# Patient Record
Sex: Male | Born: 1950 | Race: White | Hispanic: No | Marital: Married | Smoking: Never smoker
Health system: Southern US, Community
[De-identification: ages and names within clinical notes are randomized; demographics above are authoritative.]

## PROBLEM LIST (undated history)

## (undated) DIAGNOSIS — I10 Essential (primary) hypertension: Secondary | ICD-10-CM

## (undated) DIAGNOSIS — Z9889 Other specified postprocedural states: Secondary | ICD-10-CM

## (undated) DIAGNOSIS — E119 Type 2 diabetes mellitus without complications: Secondary | ICD-10-CM

## (undated) DIAGNOSIS — E039 Hypothyroidism, unspecified: Secondary | ICD-10-CM

## (undated) DIAGNOSIS — M1990 Unspecified osteoarthritis, unspecified site: Secondary | ICD-10-CM

## (undated) DIAGNOSIS — R112 Nausea with vomiting, unspecified: Secondary | ICD-10-CM

## (undated) DIAGNOSIS — C029 Malignant neoplasm of tongue, unspecified: Secondary | ICD-10-CM

## (undated) DIAGNOSIS — I639 Cerebral infarction, unspecified: Secondary | ICD-10-CM

## (undated) DIAGNOSIS — M109 Gout, unspecified: Secondary | ICD-10-CM

## (undated) DIAGNOSIS — K219 Gastro-esophageal reflux disease without esophagitis: Secondary | ICD-10-CM

## (undated) HISTORY — PX: OTHER SURGICAL HISTORY: SHX169

## (undated) HISTORY — PX: CYST EXCISION: SHX5701

## (undated) HISTORY — PX: TONGUE SURGERY: SHX810

## (undated) HISTORY — PX: FOOT SURGERY: SHX648

## (undated) HISTORY — PX: SHOULDER ARTHROSCOPY: SHX128

---

## 1998-02-19 ENCOUNTER — Encounter: Admission: RE | Admit: 1998-02-19 | Discharge: 1998-05-20 | Payer: Self-pay | Admitting: Dentistry

## 1998-07-16 ENCOUNTER — Encounter: Admission: RE | Admit: 1998-07-16 | Discharge: 1998-10-14 | Payer: Self-pay | Admitting: Dentistry

## 1998-11-18 ENCOUNTER — Encounter (HOSPITAL_COMMUNITY): Admission: RE | Admit: 1998-11-18 | Discharge: 1999-02-16 | Payer: Self-pay | Admitting: Dentistry

## 1999-06-09 ENCOUNTER — Encounter (HOSPITAL_COMMUNITY): Admission: RE | Admit: 1999-06-09 | Discharge: 1999-09-07 | Payer: Self-pay | Admitting: Dentistry

## 1999-11-03 ENCOUNTER — Encounter: Payer: Self-pay | Admitting: Orthopedic Surgery

## 1999-11-10 ENCOUNTER — Encounter (INDEPENDENT_AMBULATORY_CARE_PROVIDER_SITE_OTHER): Payer: Self-pay | Admitting: Specialist

## 1999-11-10 ENCOUNTER — Ambulatory Visit (HOSPITAL_COMMUNITY): Admission: RE | Admit: 1999-11-10 | Discharge: 1999-11-10 | Payer: Self-pay | Admitting: Orthopedic Surgery

## 2000-06-14 ENCOUNTER — Encounter (HOSPITAL_COMMUNITY): Admission: RE | Admit: 2000-06-14 | Discharge: 2000-09-12 | Payer: Self-pay | Admitting: Dentistry

## 2000-11-01 ENCOUNTER — Encounter: Payer: Self-pay | Admitting: Orthopedic Surgery

## 2000-11-01 ENCOUNTER — Ambulatory Visit (HOSPITAL_COMMUNITY): Admission: RE | Admit: 2000-11-01 | Discharge: 2000-11-01 | Payer: Self-pay | Admitting: Orthopedic Surgery

## 2000-11-26 ENCOUNTER — Encounter (HOSPITAL_COMMUNITY): Admission: RE | Admit: 2000-11-26 | Discharge: 2001-02-24 | Payer: Self-pay | Admitting: Dentistry

## 2001-05-31 ENCOUNTER — Encounter (HOSPITAL_COMMUNITY): Admission: RE | Admit: 2001-05-31 | Discharge: 2001-08-29 | Payer: Self-pay | Admitting: Dentistry

## 2001-11-29 ENCOUNTER — Encounter (HOSPITAL_COMMUNITY): Admission: RE | Admit: 2001-11-29 | Discharge: 2002-02-27 | Payer: Self-pay | Admitting: Dentistry

## 2002-06-12 ENCOUNTER — Encounter: Payer: Self-pay | Admitting: *Deleted

## 2002-06-12 ENCOUNTER — Encounter: Admission: RE | Admit: 2002-06-12 | Discharge: 2002-06-12 | Payer: Self-pay | Admitting: *Deleted

## 2003-03-12 ENCOUNTER — Encounter: Admission: RE | Admit: 2003-03-12 | Discharge: 2003-03-12 | Payer: Self-pay | Admitting: Dentistry

## 2004-12-30 ENCOUNTER — Ambulatory Visit: Payer: Self-pay | Admitting: Dentistry

## 2005-02-22 ENCOUNTER — Ambulatory Visit: Payer: Self-pay | Admitting: Dentistry

## 2005-04-05 ENCOUNTER — Ambulatory Visit: Payer: Self-pay | Admitting: Dentistry

## 2005-09-08 ENCOUNTER — Ambulatory Visit: Payer: Self-pay | Admitting: Dentistry

## 2005-12-14 ENCOUNTER — Ambulatory Visit (HOSPITAL_BASED_OUTPATIENT_CLINIC_OR_DEPARTMENT_OTHER): Admission: RE | Admit: 2005-12-14 | Discharge: 2005-12-14 | Payer: Self-pay | Admitting: Urology

## 2006-03-30 ENCOUNTER — Ambulatory Visit: Payer: Self-pay | Admitting: Dentistry

## 2006-11-09 ENCOUNTER — Ambulatory Visit: Payer: Self-pay | Admitting: Dentistry

## 2007-05-23 ENCOUNTER — Ambulatory Visit: Payer: Self-pay | Admitting: Dentistry

## 2008-10-07 ENCOUNTER — Ambulatory Visit (HOSPITAL_COMMUNITY): Admission: RE | Admit: 2008-10-07 | Discharge: 2008-10-08 | Payer: Self-pay | Admitting: Orthopedic Surgery

## 2008-10-07 ENCOUNTER — Ambulatory Visit: Admission: RE | Admit: 2008-10-07 | Discharge: 2008-12-16 | Payer: Self-pay | Admitting: Radiation Oncology

## 2010-02-08 ENCOUNTER — Emergency Department (HOSPITAL_BASED_OUTPATIENT_CLINIC_OR_DEPARTMENT_OTHER): Admission: EM | Admit: 2010-02-08 | Discharge: 2010-02-08 | Payer: Self-pay | Admitting: Emergency Medicine

## 2010-02-08 ENCOUNTER — Ambulatory Visit: Payer: Self-pay | Admitting: Diagnostic Radiology

## 2010-05-25 ENCOUNTER — Ambulatory Visit (HOSPITAL_COMMUNITY): Admission: RE | Admit: 2010-05-25 | Discharge: 2010-05-25 | Payer: Self-pay | Admitting: Orthopedic Surgery

## 2011-01-06 LAB — CBC
MCH: 30.5 pg (ref 26.0–34.0)
Platelets: 181 10*3/uL (ref 150–400)
RBC: 4.92 MIL/uL (ref 4.22–5.81)
RDW: 13.8 % (ref 11.5–15.5)
WBC: 4 10*3/uL (ref 4.0–10.5)

## 2011-01-06 LAB — SURGICAL PCR SCREEN: MRSA, PCR: NEGATIVE

## 2011-01-06 LAB — COMPREHENSIVE METABOLIC PANEL
AST: 44 U/L — ABNORMAL HIGH (ref 0–37)
Albumin: 3.9 g/dL (ref 3.5–5.2)
BUN: 13 mg/dL (ref 6–23)
Chloride: 108 mEq/L (ref 96–112)
Creatinine, Ser: 1.26 mg/dL (ref 0.4–1.5)
GFR calc Af Amer: >60 mL/min (ref >60)
Potassium: 4.3 mEq/L (ref 3.5–5.1)
Total Bilirubin: 0.8 mg/dL (ref 0.3–1.2)
Total Protein: 7.1 g/dL (ref 6.0–8.3)

## 2011-01-10 LAB — CBC
HCT: 44.2 % (ref 39.0–52.0)
Hemoglobin: 14.9 g/dL (ref 13.0–17.0)
MCHC: 33.8 g/dL (ref 30.0–36.0)
MCV: 89.1 fL (ref 78.0–100.0)
RBC: 4.96 MIL/uL (ref 4.22–5.81)
WBC: 5.7 10*3/uL (ref 4.0–10.5)

## 2011-01-10 LAB — DIFFERENTIAL
Basophils Relative: 3 % — ABNORMAL HIGH (ref 0–1)
Eosinophils Absolute: 0.2 10*3/uL (ref 0.0–0.7)
Eosinophils Relative: 3 % (ref 0–5)
Lymphs Abs: 1.4 10*3/uL (ref 0.7–4.0)
Monocytes Absolute: 0.5 10*3/uL (ref 0.1–1.0)
Monocytes Relative: 10 % (ref 3–12)

## 2011-01-10 LAB — BASIC METABOLIC PANEL
CO2: 27 mEq/L (ref 19–32)
Chloride: 107 mEq/L (ref 96–112)
Creatinine, Ser: 1.3 mg/dL (ref 0.4–1.5)
GFR calc Af Amer: >60 mL/min (ref >60)
Potassium: 4.5 mEq/L (ref 3.5–5.1)

## 2011-03-07 NOTE — Op Note (Signed)
NAME:  Henry Bridges, Henry Bridges              ACCOUNT NO.:  [id number]   MEDICAL RECORD NO.:  [id number]          PATIENT TYPE:  AMB   LOCATION:  DAY                          FACILITY:  [facility]   PHYSICIAN:  Marlowe Kays, M.D.  DATE OF BIRTH:  1950-12-24   DATE OF PROCEDURE:  10/07/2008  DATE OF DISCHARGE:                               OPERATIVE REPORT   PREOPERATIVE DIAGNOSIS:  Recurrent bone and calcium, right posterior  heel and Achilles tendon.   POSTOPERATIVE DIAGNOSIS:  Recurrent bone and calcium, right posterior  heel and Achilles tendon.   OPERATION:  Excision of bone and calcium, right posterior heel and  Achilles tendon, utilizing C-arm to be followed by 1 dose of radiation  therapy.   SURGEON:  Marlowe Kays, M.D.   ASSISTANT:  Nurse.   ANESTHESIA:  General.   PATHOLOGY AND JUSTIFICATION FOR PROCEDURE:  I excised some painful  calcium from his posterior heel back in 2001.  He is very active in  athletics, particularly softball, and has noted recurrent pain in this  area, with x-rays demonstrating extensive bone/calcium formation in the  Achilles tendon primarily.  Consequently he is here today for the above-  mentioned surgery.   DESCRIPTION OF PROCEDURE:  Under satisfactory general anesthesia,  pneumatic tourniquet, the right leg was esmarched out non sterilely and  the tourniquet inflated to 325 mmHg.  The right leg was prepped with  DuraPrep from the mid calf to the toes and draped in a sterile field.  I  made a curved hockey-stick incision medial to the Achilles tendon and  went down to the tendon, splitting it somewhat so that there was a  little remaining residual portion for suturing later.  Then using the C-  arm and a curette knife and small rongeur, I progressively removed all  the bone and calcium that I could locate without entirely destroying the  heel cord.  On the lateral projection he appeared to have complete  clearance of this on the oblique.  There may  have been a slight remnant  in the Achilles tendon but, again, I tried to preserve as much of the  Achilles tendon as possible.  At the conclusion of the removal, I  irrigated the wound well and was able to reapproximate the Achilles  tendon remaining posteriorly to what I had left on the os calcis with  multiple interrupted 0 Vicryl.  The soft tissues were infiltrated with  0.5% plain Marcaine and closure continued with interrupted 3-0 Vicryl in  the subcutaneous tissue and a running 4-0 nylon tailor stitch in the  skin.  Betadine, Adaptic, a dry sterile dressing with a well-padded  short-leg splint cast were applied.  He tolerated the procedure well and  was taken to the recovery room in satisfactory condition with no known  complications.           ______________________________  Marlowe Kays, M.D.     JA/MEDQ  D:  10/07/2008  T:  10/08/2008  Job:  [id number]

## 2011-03-10 NOTE — Op Note (Signed)
NAME:  Henry Bridges, Henry Bridges              ACCOUNT NO.:  [id number]   MEDICAL RECORD NO.:  [id number]          PATIENT TYPE:  AMB   LOCATION:  [facility]                         FACILITY:  [facility]   PHYSICIAN:  Ronald L. Earlene Plater, M.D.  DATE OF BIRTH:  January 24, 1951   DATE OF PROCEDURE:  DATE OF DISCHARGE:                                 OPERATIVE REPORT   DICTATED FOR:  Dr. Earlene Plater   PREOPERATIVE DIAGNOSIS:  Left hydrocele.   POSTOPERATIVE DIAGNOSIS:  Left hydrocele.   PROCEDURE:  1.  Left hydrocelectomy.  2.  Ablation of appendix testis.   SURGEON:  Lucrezia Starch. Earlene Plater, M.D.   ASSISTANT:  Glade Nurse, MD   ANESTHESIA:  General endotracheal.   SPECIMENS:  None.   DRAINS:  A 1/4-inch Penrose drain to the dependent portion of the left  scrotum.   PROCEDURE:  The patient was identified by his wrist bracelet and brought to  room #4 where he received preprocedure antibiotics and was administered  general anesthesia. He was prepped and draped in the usual sterile fashion  taking care to minimize risks of peripheral neuropathy or compartment  syndrome.   Next we made a transverse 3.5-cm incision across his left hemiscrotum. We  carried down through the detrusor fibers with Bovie cautery and right angle  dissection. The hydrocele sac was delivered into the field and using the  combination of blunt-and-sharp dissection we freed it from the overlying  testis, as far proximal on the cord as possible.  Next, we visualized the  testicle and the dependent portion of the hydrocele.  On opening the  hydrocele sac, anteriorly, we removed approximately 300 mL of straw-colored  fluid.  Next, we opened the hydrocele sac along its length from the inferior  portion of the testis proximally to over the cord. We then trimmed the  excess hydrocele sac with Bovie cautery, addressing all bleeding points. We  then examined the testicle; it was of normal shape and contour. There were  no palpable abnormalities. We  identified an appendix testis which was  ablated with Bovie cautery. We then bottle-necked the hydrocele sac over the  posterior aspect of the testicle and cord; and we ran it closed with a 3-0  Vicryl taking care to ensure that the bowel-necking was not too tight as to  prevent good perfusion of the testis and cord. When this was complete, we  visualized the testis. It was well-perfused.   Next we meticulously surveyed our operative field and addressed all bleeding  points with Bovie cautery. Next, we placed the testis back into the  hemiscrotum. Proper orientation is identified by the lateral sulcus. We then  placed a 1/4-inch Penrose drain in the dependent portion of left hemiscrotum  and fixed it to the skin with a 4-0 chromic.  Next, we closed the wound in 2 layers. We closed the dartos musculature with  a running 3-0 Vicryl. We then closed the skin with a running 4-0 chromic.  Dressings were applied. The patient was reversed from his anesthesia which  he tolerated without complication. Please note Dr. Darvin Neighbours was present and  participated in all aspects of this case.     ______________________________  Glade Nurse, MD      Lucrezia Starch. Earlene Plater, M.D.  Electronically Signed    MT/MEDQ  D:  12/14/2005  T:  12/15/2005  Job:  [id number]

## 2011-03-10 NOTE — Op Note (Signed)
[facility]  Patient:    Henry Bridges, Henry Bridges                     MRN: [id number] Proc. Date: 11/01/00 Adm. Date:  [id number] Attending:  Marlowe Kays Page                           Operative Report  PREOPERATIVE DIAGNOSIS:  Painful os calcis heel spur, left.  POSTOPERATIVE DIAGNOSIS:  Painful os calcis heel spur, left.  OPERATION:  Removal of painful posterior os calcis spur, left foot.  SURGEON:  Illene Labrador. [place], M.D.  ASSISTANT:  Nurse.  ANESTHESIA:  General.  PATHOLOGY AND JUSTIFICATION FOR PROCEDURE:  I previously removed one from his right heel roughly a year ago with excellent result.  He has a large spur digging up in his Achilles tendon which has failed to respond to nonsurgical treatment.  DESCRIPTION OF PROCEDURE:  Satisfactory general anesthesia, pneumatic tourniquet applied.  He was then placed in the prone position on rolls, and his left lower extremity from mid calf to toes was prepped with Duraprep and draped in a sterile field.  I made a curved incision started proximally and laterally and curving about the level of the heel spur transversely and then medially over the inner heel.  Skin incision was carried down to the Achilles tendon.  There was a little fibrovascular tissue which I excised.  The heel cord was then split vertically and the spur identified and resected with small rongeur.  There appeared to be some degeneration of the Achilles tendon fibers.  I then brought in the C-arm and with lateral x-ray noted that there was still a small residual portion of bone remaining which I removed.  On follow-up lateral C-arm x-ray, the heel spur had been completely removed.  I saved this picture for permanency.  The wound was irrigated with sterile saline, and soft tissue was infiltrated with 0.5% plain Marcaine.  The Achilles tendon was reapproximated with interrupted 0 Vicryl and skin with 4-0 nylon mattress sutures.  Betadine  and Adaptic dry sterile dressing and short leg splint cast was applied.  He tolerated the procedure well and was taken to the recovery room in satisfactory condition. DD:  11/01/00 TD:  11/02/00 Job: [id number]

## 2011-04-23 DEATH — deceased

## 2011-07-28 LAB — BASIC METABOLIC PANEL
BUN: 15 mg/dL (ref 6–23)
CO2: 27 mEq/L (ref 19–32)
Calcium: 9.5 mg/dL (ref 8.4–10.5)
GFR calc non Af Amer: 59 mL/min — ABNORMAL LOW (ref >60)
Glucose, Bld: 111 mg/dL — ABNORMAL HIGH (ref 70–99)
Sodium: 138 mEq/L (ref 135–145)

## 2013-06-16 ENCOUNTER — Other Ambulatory Visit: Payer: Self-pay | Admitting: Family Medicine

## 2013-06-16 DIAGNOSIS — R748 Abnormal levels of other serum enzymes: Secondary | ICD-10-CM

## 2013-06-26 ENCOUNTER — Ambulatory Visit
Admission: RE | Admit: 2013-06-26 | Discharge: 2013-06-26 | Disposition: A | Discharge status: Home / Self Care | Payer: 59 | Source: Ambulatory Visit | Attending: Family Medicine | Admitting: Family Medicine

## 2013-06-26 DIAGNOSIS — R748 Abnormal levels of other serum enzymes: Secondary | ICD-10-CM

## 2017-07-11 DIAGNOSIS — M7501 Adhesive capsulitis of right shoulder: Secondary | ICD-10-CM | POA: Diagnosis not present

## 2017-07-16 DIAGNOSIS — Z13220 Encounter for screening for lipoid disorders: Secondary | ICD-10-CM | POA: Diagnosis not present

## 2017-07-16 DIAGNOSIS — Z0000 Encounter for general adult medical examination without abnormal findings: Secondary | ICD-10-CM | POA: Diagnosis not present

## 2017-07-16 DIAGNOSIS — E119 Type 2 diabetes mellitus without complications: Secondary | ICD-10-CM | POA: Diagnosis not present

## 2017-07-16 DIAGNOSIS — M109 Gout, unspecified: Secondary | ICD-10-CM | POA: Diagnosis not present

## 2017-07-18 DIAGNOSIS — Z0000 Encounter for general adult medical examination without abnormal findings: Secondary | ICD-10-CM | POA: Diagnosis not present

## 2017-07-18 DIAGNOSIS — Z23 Encounter for immunization: Secondary | ICD-10-CM | POA: Diagnosis not present

## 2017-07-18 DIAGNOSIS — E119 Type 2 diabetes mellitus without complications: Secondary | ICD-10-CM | POA: Diagnosis not present

## 2017-08-20 DIAGNOSIS — M25511 Pain in right shoulder: Secondary | ICD-10-CM | POA: Diagnosis not present

## 2017-08-20 DIAGNOSIS — G8929 Other chronic pain: Secondary | ICD-10-CM | POA: Diagnosis not present

## 2017-08-20 DIAGNOSIS — M7501 Adhesive capsulitis of right shoulder: Secondary | ICD-10-CM | POA: Diagnosis not present

## 2018-01-11 DIAGNOSIS — E119 Type 2 diabetes mellitus without complications: Secondary | ICD-10-CM | POA: Diagnosis not present

## 2018-08-06 DIAGNOSIS — E119 Type 2 diabetes mellitus without complications: Secondary | ICD-10-CM | POA: Diagnosis not present

## 2018-08-07 DIAGNOSIS — Z23 Encounter for immunization: Secondary | ICD-10-CM | POA: Diagnosis not present

## 2018-08-07 DIAGNOSIS — K760 Fatty (change of) liver, not elsewhere classified: Secondary | ICD-10-CM | POA: Diagnosis not present

## 2018-08-07 DIAGNOSIS — I10 Essential (primary) hypertension: Secondary | ICD-10-CM | POA: Diagnosis not present

## 2018-08-07 DIAGNOSIS — Z0000 Encounter for general adult medical examination without abnormal findings: Secondary | ICD-10-CM | POA: Diagnosis not present

## 2018-08-07 DIAGNOSIS — E039 Hypothyroidism, unspecified: Secondary | ICD-10-CM | POA: Diagnosis not present

## 2018-08-07 DIAGNOSIS — E1169 Type 2 diabetes mellitus with other specified complication: Secondary | ICD-10-CM | POA: Diagnosis not present

## 2018-08-07 DIAGNOSIS — Z8739 Personal history of other diseases of the musculoskeletal system and connective tissue: Secondary | ICD-10-CM | POA: Diagnosis not present

## 2019-05-07 ENCOUNTER — Observation Stay (HOSPITAL_BASED_OUTPATIENT_CLINIC_OR_DEPARTMENT_OTHER)
Admission: EM | Admit: 2019-05-07 | Discharge: 2019-05-09 | Disposition: A | Discharge status: Home / Self Care | Payer: BC Managed Care – PPO | Attending: Internal Medicine | Admitting: Internal Medicine

## 2019-05-07 ENCOUNTER — Other Ambulatory Visit: Payer: Self-pay

## 2019-05-07 ENCOUNTER — Observation Stay (HOSPITAL_COMMUNITY): Payer: BC Managed Care – PPO

## 2019-05-07 ENCOUNTER — Encounter (HOSPITAL_BASED_OUTPATIENT_CLINIC_OR_DEPARTMENT_OTHER): Payer: Self-pay

## 2019-05-07 ENCOUNTER — Emergency Department (HOSPITAL_BASED_OUTPATIENT_CLINIC_OR_DEPARTMENT_OTHER): Payer: BC Managed Care – PPO

## 2019-05-07 DIAGNOSIS — H538 Other visual disturbances: Secondary | ICD-10-CM | POA: Diagnosis not present

## 2019-05-07 DIAGNOSIS — Z85810 Personal history of malignant neoplasm of tongue: Secondary | ICD-10-CM | POA: Insufficient documentation

## 2019-05-07 DIAGNOSIS — Z7984 Long term (current) use of oral hypoglycemic drugs: Secondary | ICD-10-CM | POA: Diagnosis not present

## 2019-05-07 DIAGNOSIS — G459 Transient cerebral ischemic attack, unspecified: Principal | ICD-10-CM

## 2019-05-07 DIAGNOSIS — I10 Essential (primary) hypertension: Secondary | ICD-10-CM | POA: Diagnosis not present

## 2019-05-07 DIAGNOSIS — Z7982 Long term (current) use of aspirin: Secondary | ICD-10-CM | POA: Diagnosis not present

## 2019-05-07 DIAGNOSIS — R531 Weakness: Secondary | ICD-10-CM | POA: Diagnosis not present

## 2019-05-07 DIAGNOSIS — E119 Type 2 diabetes mellitus without complications: Secondary | ICD-10-CM

## 2019-05-07 DIAGNOSIS — M6281 Muscle weakness (generalized): Secondary | ICD-10-CM | POA: Diagnosis not present

## 2019-05-07 DIAGNOSIS — R51 Headache: Secondary | ICD-10-CM | POA: Insufficient documentation

## 2019-05-07 DIAGNOSIS — I771 Stricture of artery: Secondary | ICD-10-CM

## 2019-05-07 DIAGNOSIS — E039 Hypothyroidism, unspecified: Secondary | ICD-10-CM | POA: Insufficient documentation

## 2019-05-07 DIAGNOSIS — Z20828 Contact with and (suspected) exposure to other viral communicable diseases: Secondary | ICD-10-CM | POA: Insufficient documentation

## 2019-05-07 DIAGNOSIS — R29898 Other symptoms and signs involving the musculoskeletal system: Secondary | ICD-10-CM

## 2019-05-07 DIAGNOSIS — Z79899 Other long term (current) drug therapy: Secondary | ICD-10-CM | POA: Insufficient documentation

## 2019-05-07 HISTORY — DX: Gastro-esophageal reflux disease without esophagitis: K21.9

## 2019-05-07 HISTORY — DX: Type 2 diabetes mellitus without complications: E11.9

## 2019-05-07 HISTORY — DX: Essential (primary) hypertension: I10

## 2019-05-07 HISTORY — DX: Malignant neoplasm of tongue, unspecified: C02.9

## 2019-05-07 HISTORY — DX: Hypothyroidism, unspecified: E03.9

## 2019-05-07 LAB — COMPREHENSIVE METABOLIC PANEL
ALT: 24 U/L (ref 0–44)
AST: 23 U/L (ref 15–41)
Albumin: 4.3 g/dL (ref 3.5–5.0)
Alkaline Phosphatase: 73 U/L (ref 38–126)
Anion gap: 12 (ref 5–15)
BUN: 18 mg/dL (ref 8–23)
CO2: 23 mmol/L (ref 22–32)
Calcium: 9.4 mg/dL (ref 8.9–10.3)
Chloride: 103 mmol/L (ref 98–111)
Creatinine, Ser: 1.08 mg/dL (ref 0.61–1.24)
GFR calc Af Amer: >60 mL/min (ref >60)
GFR calc non Af Amer: >60 mL/min (ref >60)
Glucose, Bld: 171 mg/dL — ABNORMAL HIGH (ref 70–99)
Potassium: 3.7 mmol/L (ref 3.5–5.1)
Sodium: 138 mmol/L (ref 135–145)
Total Bilirubin: 0.9 mg/dL (ref 0.3–1.2)
Total Protein: 7.6 g/dL (ref 6.5–8.1)

## 2019-05-07 LAB — DIFFERENTIAL
Abs Immature Granulocytes: 0.01 10*3/uL (ref 0.00–0.07)
Basophils Absolute: 0 10*3/uL (ref 0.0–0.1)
Basophils Relative: 0 %
Eosinophils Absolute: 0.1 10*3/uL (ref 0.0–0.5)
Eosinophils Relative: 2 %
Immature Granulocytes: 0 %
Lymphocytes Relative: 13 %
Lymphs Abs: 0.8 10*3/uL (ref 0.7–4.0)
Monocytes Absolute: 0.4 10*3/uL (ref 0.1–1.0)
Monocytes Relative: 7 %
Neutro Abs: 4.8 10*3/uL (ref 1.7–7.7)
Neutrophils Relative %: 78 %

## 2019-05-07 LAB — APTT: aPTT: 25 seconds (ref 24–36)

## 2019-05-07 LAB — PROTIME-INR
INR: 0.9 (ref 0.8–1.2)
Prothrombin Time: 12.4 seconds (ref 11.4–15.2)

## 2019-05-07 LAB — CBC
HCT: 41.7 % (ref 39.0–52.0)
Hemoglobin: 13.8 g/dL (ref 13.0–17.0)
MCH: 29.4 pg (ref 26.0–34.0)
MCHC: 33.1 g/dL (ref 30.0–36.0)
MCV: 88.7 fL (ref 80.0–100.0)
Platelets: 193 10*3/uL (ref 150–400)
RBC: 4.7 MIL/uL (ref 4.22–5.81)
RDW: 12.5 % (ref 11.5–15.5)
WBC: 6.1 10*3/uL (ref 4.0–10.5)
nRBC: 0 % (ref 0.0–0.2)

## 2019-05-07 LAB — CBG MONITORING, ED: Glucose-Capillary: 161 mg/dL — ABNORMAL HIGH (ref 70–99)

## 2019-05-07 LAB — SARS CORONAVIRUS 2 BY RT PCR (HOSPITAL ORDER, PERFORMED IN ~~LOC~~ HOSPITAL LAB): SARS Coronavirus 2: NEGATIVE

## 2019-05-07 MED ORDER — ENOXAPARIN SODIUM 40 MG/0.4ML ~~LOC~~ SOLN
40.0000 mg | SUBCUTANEOUS | Status: DC
  Administered 2019-05-08 – 2019-05-09 (×2): 40 mg via SUBCUTANEOUS
  Filled 2019-05-07 (×2): qty 0.4

## 2019-05-07 MED ORDER — ASPIRIN 300 MG RE SUPP: 300.0000 mg | Freq: Every day | RECTAL | Status: DC

## 2019-05-07 MED ORDER — ASPIRIN 325 MG PO TABS
325.0000 mg | ORAL_TABLET | Freq: Every day | ORAL | Status: DC
  Administered 2019-05-08 (×2): 325 mg via ORAL
  Filled 2019-05-07 (×2): qty 1

## 2019-05-07 MED ORDER — PANTOPRAZOLE SODIUM 40 MG PO TBEC
40.0000 mg | DELAYED_RELEASE_TABLET | Freq: Every day | ORAL | Status: DC
  Administered 2019-05-08 – 2019-05-09 (×2): 40 mg via ORAL
  Filled 2019-05-07 (×2): qty 1

## 2019-05-07 MED ORDER — LEVOTHYROXINE SODIUM 25 MCG PO TABS
125.0000 ug | ORAL_TABLET | Freq: Every day | ORAL | Status: DC
  Administered 2019-05-08 – 2019-05-09 (×2): 125 ug via ORAL
  Filled 2019-05-07 (×2): qty 1

## 2019-05-07 MED ORDER — ACETAMINOPHEN 650 MG RE SUPP: 650.0000 mg | RECTAL | Status: DC | PRN

## 2019-05-07 MED ORDER — ACETAMINOPHEN 325 MG PO TABS
650.0000 mg | ORAL_TABLET | ORAL | Status: DC | PRN
  Administered 2019-05-09: 650 mg via ORAL
  Filled 2019-05-07: qty 2

## 2019-05-07 MED ORDER — INSULIN ASPART 100 UNIT/ML ~~LOC~~ SOLN
0.0000 [IU] | Freq: Three times a day (TID) | SUBCUTANEOUS | Status: DC
  Administered 2019-05-08: 1 [IU] via SUBCUTANEOUS

## 2019-05-07 MED ORDER — SODIUM CHLORIDE 0.9% FLUSH
3.0000 mL | Freq: Once | INTRAVENOUS | Status: DC
Start: 2019-05-07 — End: 2019-05-09
  Filled 2019-05-07: qty 3

## 2019-05-07 MED ORDER — ACETAMINOPHEN 160 MG/5ML PO SOLN: 650.0000 mg | ORAL | Status: DC | PRN

## 2019-05-07 MED ORDER — STROKE: EARLY STAGES OF RECOVERY BOOK
Freq: Once | Status: AC
  Administered 2019-05-08
  Filled 2019-05-07: qty 1

## 2019-05-07 MED ORDER — INSULIN ASPART 100 UNIT/ML ~~LOC~~ SOLN: 0.0000 [IU] | Freq: Every day | SUBCUTANEOUS | Status: DC

## 2019-05-07 MED ORDER — SENNOSIDES-DOCUSATE SODIUM 8.6-50 MG PO TABS: 1.0000 | ORAL_TABLET | Freq: Every evening | ORAL | Status: DC | PRN

## 2019-05-07 MED ORDER — SODIUM CHLORIDE 0.9 % IV SOLN
INTRAVENOUS | Status: AC
  Administered 2019-05-08: 01:00:00 via INTRAVENOUS

## 2019-05-07 NOTE — ED Notes (Signed)
ED Provider at bedside. 

## 2019-05-07 NOTE — ED Provider Notes (Signed)
[facility] EMERGENCY DEPARTMENT Provider Note   CSN: [id number] Arrival date & time: 05/07/19  1145     History   Chief Complaint Chief Complaint  Patient presents with  . Weakness    HPI Henry Bridges is a 68 y.o. male.  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  He said he was sitting at his computer sometime between 9 and 930 when he experienced a frontal headache associated with some blurry vision and some right hand clumsiness.  He said he could not talk with his right hand.  He said this lasted for about 30 minutes and resolved.  Currently no blurry vision and no difficulty with his hands.  Says he still has a little soreness behind his left eye.  He talked to his family members who made him come to the hospital.  He said he was a little nauseous in the car ride over.  No prior history of stroke.  No numbness no difficulty with gait.     The history is provided by the patient.  Cerebrovascular Accident This is a new problem. The current episode started 3 to 5 hours ago. The problem has been resolved. Associated symptoms include headaches. Pertinent negatives include no chest pain, no abdominal pain and no shortness of breath. Nothing aggravates the symptoms. Nothing relieves the symptoms. He has tried nothing for the symptoms. The treatment provided no relief.    Past Medical History:  Diagnosis Date  . Diabetes mellitus without complication ([place])   . GERD (gastroesophageal reflux disease)   . Hypertension   . Tongue cancer ([place])     There are no active problems to display for this patient.   Past Surgical History:  Procedure Laterality Date  . bone spur    . TONGUE SURGERY          Home Medications    Prior to Admission medications   Not on File    Family History No family history on file.  Social History Social History   Tobacco Use  . Smoking status: Never Smoker  . Smokeless tobacco: Never Used   Substance Use Topics  . Alcohol use: Not Currently  . Drug use: Not on file     Allergies   Aspirin   Review of Systems Review of Systems  Constitutional: Negative for fever.  HENT: Negative for sore throat.   Eyes: Positive for visual disturbance.  Respiratory: Negative for shortness of breath.   Cardiovascular: Negative for chest pain.  Gastrointestinal: Negative for abdominal pain.  Genitourinary: Negative for dysuria.  Musculoskeletal: Negative for neck pain.  Skin: Negative for rash.  Neurological: Positive for weakness and headaches. Negative for speech difficulty and numbness.     Physical Exam Updated Vital Signs BP 130/72 (BP Location: Right Arm)   Pulse (!) 50   Temp 98.2 F (36.8 C) (Oral)   Resp 16   SpO2 97%   Physical Exam Vitals signs and nursing note reviewed.  Constitutional:      Appearance: He is well-developed.  HENT:     Head: Normocephalic and atraumatic.  Eyes:     Conjunctiva/sclera: Conjunctivae normal.  Neck:     Musculoskeletal: Neck supple.  Cardiovascular:     Rate and Rhythm: Normal rate and regular rhythm.     Heart sounds: No murmur.  Pulmonary:     Effort: Pulmonary effort is normal. No respiratory distress.     Breath sounds: Normal breath sounds.  Abdominal:     Palpations: Abdomen is soft.     Tenderness: There is no abdominal tenderness.  Skin:    General: Skin is warm and dry.     Capillary Refill: Capillary refill takes less than 2 seconds.  Neurological:     General: No focal deficit present.     Mental Status: He is alert.     GCS: GCS eye subscore is 4. GCS verbal subscore is 5. GCS motor subscore is 6.     Cranial Nerves: No dysarthria.     Sensory: Sensation is intact.     Motor: Motor function is intact. No pronator drift.     Coordination: Coordination is intact. Coordination normal. Finger-Nose-Finger Test normal. Rapid alternating movements normal.     Comments: There is some slight deviation of his  tongue although he said he had prior surgery.      ED Treatments / Results  Labs (all labs ordered are listed, but only abnormal results are displayed) Labs Reviewed  COMPREHENSIVE METABOLIC PANEL - Abnormal; Notable for the following components:      Result Value   Glucose, Bld 171 (*)    All other components within normal limits  HEMOGLOBIN A1C - Abnormal; Notable for the following components:   Hgb A1c MFr Bld 6.8 (*)    All other components within normal limits  LIPID PANEL - Abnormal; Notable for the following components:   LDL Cholesterol 120 (*)    All other components within normal limits  GLUCOSE, CAPILLARY - Abnormal; Notable for the following components:   Glucose-Capillary 139 (*)    All other components within normal limits  GLUCOSE, CAPILLARY - Abnormal; Notable for the following components:   Glucose-Capillary 126 (*)    All other components within normal limits  CBG MONITORING, ED - Abnormal; Notable for the following components:   Glucose-Capillary 161 (*)    All other components within normal limits  SARS CORONAVIRUS 2 (HOSPITAL ORDER, [facility])  PROTIME-INR  APTT  CBC  DIFFERENTIAL  CBC  HIV ANTIBODY (ROUTINE TESTING W REFLEX)  RAPID URINE DRUG SCREEN, HOSP PERFORMED  CBG MONITORING, ED    EKG EKG Interpretation  Date/Time:  Wednesday May 07 2019 11:57:33 EDT Ventricular Rate:  52 PR Interval:  164 QRS Duration: 108 QT Interval:  444 QTC Calculation: 412 R Axis:   80 Text Interpretation:  Sinus bradycardia Otherwise normal ECG similar to prior 8/11 Confirmed by Aletta Edouard [phone]) on 05/07/2019 12:31:23 PM   Radiology [name] Contrast  Result Date: 05/08/2019 CLINICAL DATA:  68 y/o M; headache, blurred vision, right arm weakness. TIA, initial exam. History of tongue cancer post resection and radiation. EXAM: MRI HEAD WITHOUT CONTRAST TECHNIQUE: Multiplanar, multiecho pulse sequences of the brain and  surrounding structures were obtained without intravenous contrast. COMPARISON:  05/07/2019 CT head. FINDINGS: Brain: Mildly increased diffusion signal and mildly decreased ADC of the left caudate nucleus head and body on the axial diffusion sequence (series 8, image 30) and to lesser degree on the coronal diffusion sequence (series 1210, image 22). No associated hemorrhage or mass effect. On the axial DWI sequence there is also increased signal within the left anterior cingulate gyrus, right insula, and right hippocampus, however, this is not replicated on the coronal sequences and may be artifactual. No additional corresponding signal abnormality on other sequences. No acute hemorrhage, hydrocephalus, extra-axial collection, or mass effect. Scattered punctate nonspecific T2 FLAIR hyperintensities in subcortical and periventricular white matter  are compatible with mild chronic microvascular ischemic changes. Mild volume loss of the brain. Vascular: Normal flow voids. Skull and upper cervical spine: Normal marrow signal. Sinuses/Orbits: Negative. Other: None. IMPRESSION: Mildly decreased diffusion of the left caudate nucleus. Findings may represent acute/early subacute infarction, however, the distribution is somewhat atypical for lacunar infarction raising the question of seizure related activity or metabolic/toxic injury. Electronically Signed   By: Kristine Garbe M.D.   On: 05/08/2019 00:24   Ct Head Code Stroke Wo Contrast  Result Date: 05/07/2019 CLINICAL DATA:  Code stroke. Headache above the left eye, blurred vision, and right hand weakness lasting 15-30 minutes. EXAM: CT HEAD WITHOUT CONTRAST TECHNIQUE: Contiguous axial images were obtained from the base of the skull through the vertex without intravenous contrast. COMPARISON:  None. FINDINGS: Brain: There is no evidence of acute infarct, intracranial hemorrhage, mass, midline shift, or extra-axial fluid collection. The ventricles and sulci are  within normal limits for age. Patchy hypodensities in the cerebral white matter bilaterally are nonspecific but compatible with mild chronic small vessel ischemic disease. Vascular: No hyperdense vessel. Skull: No fracture or suspicious osseous lesion. Sinuses/Orbits: Visualized paranasal sinuses and mastoid air cells are clear. Visualized orbits are unremarkable. Other: None. ASPECTS [facility] Stroke Program Early CT Score) - Ganglionic level infarction (caudate, lentiform nuclei, internal capsule, insula, M1-M3 cortex): 7 - Supraganglionic infarction (M4-M6 cortex): 3 Total score (0-10 with 10 being normal): 10 IMPRESSION: 1. No evidence of acute intracranial abnormality. 2. ASPECTS is 10. 3. Mild chronic small vessel ischemic disease. These results were called by telephone at the time of interpretation on 05/07/2019 at 1:00 pm to Dr. Aletta Edouard , who verbally acknowledged these results. Electronically Signed   By: Logan Bores M.D.   On: 05/07/2019 13:01    Procedures Procedures (including critical care time)  Medications Ordered in ED Medications  sodium chloride flush (NS) 0.9 % injection 3 mL (has no administration in time range)     Initial Impression / Assessment and Plan / ED Course  I have reviewed the triage vital signs and the nursing notes.  Pertinent labs & imaging results that were available during my care of the patient were reviewed by me and considered in my medical decision making (see chart for details).  Clinical Course as of May 07 1040  Wed May 06, 6254  5143 68 year old male here with headache blurry vision and right hand clumsiness that started about 3 hours ago and has since resolved except for some mild left orbital headache.  He has a normal neurologic exam currently.  Differential includes TIA, stroke, LVO, atypical migraine.  I placed him in for a tele-neurology consult.   [MB]  2440 Patient's head CT reviewed by me.  No gross bladder mass noted.  Awaiting  radiology input.  Discussed with tele-neurology tech who is setting up to evaluate the patient in the emergency department.   [MB]  1027 Received a call that the patient's CT head did not show any acute findings only some small vessel disease.  Awaiting neurology input.   [MB]  2536 Neuro feels this is either a TIA or a complex migraine.  He is recommending with the patient's age that he should probably be admitted to the hospital for the TIA work-up including MRI.  I reviewed this with the patient and although he is not excited about it he is agreeable to admission.  I put out a page to the [facility] hospitalist.   [MB]  1406 Discussed with  Dr. Tawny Hopping hospitalist who accepts the patient for transfer.   [MB]    Clinical Course User Index [MB] Hayden Rasmussen, MD        Final Clinical Impressions(s) / ED Diagnoses   Final diagnoses:  Right hand weakness    ED Discharge Orders    None       Hayden Rasmussen, MD 05/08/19 1043

## 2019-05-07 NOTE — Consult Note (Addendum)
Referring Physician: Dr. Myna Hidalgo    Chief Complaint: Blurred vision and RUE weakness  HPI: Henry Bridges is an 68 y.o. male who initially presented to OSH for evaluation of acute onset of right hand weakness and incoordination in association with a frontal headache. The headache started first, followed soon thereafter by the right hand symptoms. This occurred while typing at his computer. He states that his right hand stopped working correctly and also felt weak - "I could not get it to type". The hand weakness lasted for about 10 minutes before subsiding. The headache converted into left retroorbital eye aching which lasted for another 1.5 hours. He did not have any other neurological symptoms, including no confusion, no vision changes, no sensory numbness, no imbalance, no vertigo and no weakness involving his LUE or BLE's. He also denies having had CP, SOB, fever, diarrhea, or abdominal pain.  He was transferred to [facility] for further evaluation. MRI here showed mild diffuse DWI hyperintensity in the left caudate body that overlaps the adjacent white matter. It was read as possibly representing cytotoxic edema from seizure activity versus a small stroke, however, the appearance is more consistent with artifact upon my review of the images, given no corresponding ADC map abnormality and no corresponding hyperintensity on FLAIR or T2, as well as the unusual shape/distribution of the apparent signal change on DWI.   Of note, he has no history of migraine headache.    Past Medical History:  Diagnosis Date  . Diabetes mellitus without complication ([place])   . GERD (gastroesophageal reflux disease)   . Hypertension   . Hypothyroidism   . 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 bone spur    . TONGUE SURGERY      History reviewed. No pertinent family history. Social History:  reports that he has never smoked. He has never used smokeless tobacco. He reports  previous alcohol use. No history on file for drug.  Allergies:  Allergies  Allergen Reactions  . Aspirin     Medications:  Prior to Admission:  No medications prior to admission.   Scheduled: . aspirin  300 mg Rectal Daily   Or  . aspirin  325 mg Oral Daily  . enoxaparin (LOVENOX) injection  40 mg Subcutaneous Q24H  . insulin aspart  0-5 Units Subcutaneous QHS  . insulin aspart  0-9 Units Subcutaneous TID WC  . levothyroxine  125 mcg Oral Q0600  . pantoprazole  40 mg Oral Daily  . sodium chloride flush  3 mL Intravenous Once   Continuous: . sodium chloride 75 mL/hr at 05/08/19 0034    ROS: As per HPI. Comprehensive ROS with all other systems negative.    Physical Examination: Blood pressure 134/62, pulse (!) 48, temperature 98 F (36.7 C), temperature source Oral, resp. rate 11, height 5\' 10"  (1.778 m), weight 105 kg, SpO2 98 %.  HEENT: /AT Lungs: Respirations unlabored Ext: No edema  Neurologic Examination: Mental Status:  Alert, fully oriented, thought content appropriate.  Speech fluent without evidence of aphasia.  Able to follow all commands without difficulty. Cranial Nerves: II:  Visual fields intact with no extinction to DSS. PERRL.  III,IV, VI: EOMI. No nystagmus. No ptosis.  V,VII: Smile symmetric, facial temp sensation equal bilaterally VIII: hearing intact to voice IX,X: Palate rises symmetrically XI: Symmetric shoulder shrug XII: midline tongue extension  Motor: Right : Upper extremity   5/5    Left:  Upper extremity   5/5  Lower extremity   5/5     Lower extremity   5/5 No pronator drift.  Exam includes detailed assessment of right hand with intrinsic muscles and grip strength normal and symmetric relative to the left.  Sensory: Temp and light touch intact throughout, bilaterally. Focused examination of right hand reveals no deficit.  Deep Tendon Reflexes:  2+ bilateral biceps, brachioradialis, patellae and achilles Plantars: Right:  downgoing   Left: downgoing Cerebellar: No ataxia with FNF bilaterally. Alternating finger-tap normal on right and left.  Gait: Deferred  Results for orders placed or performed during the hospital encounter of 05/07/19 (from the past 48 hour(s))  CBG monitoring, ED     Status: Abnormal   Collection Time: 05/07/19 12:16 PM  Result Value Ref Range   Glucose-Capillary 161 (H) 70 - 99 mg/dL  Protime-INR     Status: None   Collection Time: 05/07/19 12:37 PM  Result Value Ref Range   Prothrombin Time 12.4 11.4 - 15.2 seconds   INR 0.9 0.8 - 1.2    Comment: (NOTE) INR goal varies based on device and disease states. Performed at [facility], [address] [place], [place] [address]   APTT     Status: None   Collection Time: 05/07/19 12:37 PM  Result Value Ref Range   aPTT 25 24 - 36 seconds    Comment: Performed at [facility], [address] [place], [place] [address]  CBC     Status: None   Collection Time: 05/07/19 12:37 PM  Result Value Ref Range   WBC 6.1 4.0 - 10.5 K/uL   RBC 4.70 4.22 - 5.81 MIL/uL   Hemoglobin 13.8 13.0 - 17.0 g/dL   HCT 41.7 39.0 - 52.0 %   MCV 88.7 80.0 - 100.0 fL   MCH 29.4 26.0 - 34.0 pg   MCHC 33.1 30.0 - 36.0 g/dL   RDW 12.5 11.5 - 15.5 %   Platelets 193 150 - 400 K/uL   nRBC 0.0 0.0 - 0.2 %    Comment: Performed at [facility], [address] [place], [place] [address]  Differential     Status: None   Collection Time: 05/07/19 12:37 PM  Result Value Ref Range   Neutrophils Relative % 78 %   Neutro Abs 4.8 1.7 - 7.7 K/uL   Lymphocytes Relative 13 %   Lymphs Abs 0.8 0.7 - 4.0 K/uL   Monocytes Relative 7 %   Monocytes Absolute 0.4 0.1 - 1.0 K/uL   Eosinophils Relative 2 %   Eosinophils Absolute 0.1 0.0 - 0.5 K/uL   Basophils Relative 0 %   Basophils Absolute 0.0 0.0 - 0.1 K/uL   Immature Granulocytes 0 %   Abs Immature Granulocytes 0.01 0.00 - 0.07 K/uL    Comment: Performed at [facility],  [address] [place], [place] [address]  Comprehensive metabolic panel     Status: Abnormal   Collection Time: 05/07/19 12:37 PM  Result Value Ref Range   Sodium 138 135 - 145 mmol/L   Potassium 3.7 3.5 - 5.1 mmol/L   Chloride 103 98 - 111 mmol/L   CO2 23 22 - 32 mmol/L   Glucose, Bld 171 (H) 70 - 99 mg/dL   BUN 18 8 - 23 mg/dL   Creatinine, Ser 1.08 0.61 - 1.24 mg/dL   Calcium 9.4 8.9 - 10.3 mg/dL   Total Protein 7.6  6.5 - 8.1 g/dL   Albumin 4.3 3.5 - 5.0 g/dL   AST 23 15 - 41 U/L   ALT 24 0 - 44 U/L   Alkaline Phosphatase 73 38 - 126 U/L   Total Bilirubin 0.9 0.3 - 1.2 mg/dL   GFR calc non Af Amer >60 >60 mL/min   GFR calc Af Amer >60 >60 mL/min   Anion gap 12 5 - 15    Comment: Performed at [facility], [address] [place], [place] [address]  SARS Coronavirus 2 (CEPHEID - Performed in [place] hospital lab), Hosp Order     Status: None   Collection Time: 05/07/19 12:55 PM   Specimen: Nasopharyngeal Swab  Result Value Ref Range   SARS Coronavirus 2 NEGATIVE NEGATIVE    Comment: (NOTE) If result is NEGATIVE SARS-CoV-2 target nucleic acids are NOT DETECTED. The SARS-CoV-2 RNA is generally detectable in upper and lower  respiratory specimens during the acute phase of infection. The lowest  concentration of SARS-CoV-2 viral copies this assay can detect is 250  copies / mL. A negative result does not preclude SARS-CoV-2 infection  and should not be used as the sole basis for treatment or other  patient management decisions.  A negative result may occur with  improper specimen collection / handling, submission of specimen other  than nasopharyngeal swab, presence of viral mutation(s) within the  areas targeted by this assay, and inadequate number of viral copies  (<250 copies / mL). A negative result must be combined with clinical  observations, patient history, and epidemiological information. If result is POSITIVE SARS-CoV-2 target nucleic acids are  DETECTED. The SARS-CoV-2 RNA is generally detectable in upper and lower  respiratory specimens dur ing the acute phase of infection.  Positive  results are indicative of active infection with SARS-CoV-2.  Clinical  correlation with patient history and other diagnostic information is  necessary to determine patient infection status.  Positive results do  not rule out bacterial infection or co-infection with other viruses. If result is PRESUMPTIVE POSTIVE SARS-CoV-2 nucleic acids MAY BE PRESENT.   A presumptive positive result was obtained on the submitted specimen  and confirmed on repeat testing.  While 2019 novel coronavirus  (SARS-CoV-2) nucleic acids may be present in the submitted sample  additional confirmatory testing may be necessary for epidemiological  and / or clinical management purposes  to differentiate between  SARS-CoV-2 and other Sarbecovirus currently known to infect humans.  If clinically indicated additional testing with an alternate test  methodology [phone]) is advised. The SARS-CoV-2 RNA is generally  detectable in upper and lower respiratory sp ecimens during the acute  phase of infection. The expected result is Negative. Fact Sheet for Patients:  StrictlyIdeas.no Fact Sheet for Healthcare Providers: BankingDealers.co.za This test is not yet approved or cleared by the [place] FDA and has been authorized for detection and/or diagnosis of SARS-CoV-2 by FDA under an Emergency Use Authorization (EUA).  This EUA will remain in effect (meaning this test can be used) for the duration of the COVID-19 declaration under Section 564(b)(1) of the Act, 21 U.S.C. section 360bbb-3(b)(1), unless the authorization is terminated or revoked sooner. Performed at [facility], [address] [place], [place] [address]    Ct Head Code Stroke Wo Contrast  Result Date: 05/07/2019 CLINICAL DATA:  Code stroke.  Headache above the left eye, blurred vision, and right hand weakness lasting 15-30 minutes. EXAM: CT HEAD WITHOUT CONTRAST TECHNIQUE: Contiguous  axial images were obtained from the base of the skull through the vertex without intravenous contrast. COMPARISON:  None. FINDINGS: Brain: There is no evidence of acute infarct, intracranial hemorrhage, mass, midline shift, or extra-axial fluid collection. The ventricles and sulci are within normal limits for age. Patchy hypodensities in the cerebral white matter bilaterally are nonspecific but compatible with mild chronic small vessel ischemic disease. Vascular: No hyperdense vessel. Skull: No fracture or suspicious osseous lesion. Sinuses/Orbits: Visualized paranasal sinuses and mastoid air cells are clear. Visualized orbits are unremarkable. Other: None. ASPECTS [facility] Stroke Program Early CT Score) - Ganglionic level infarction (caudate, lentiform nuclei, internal capsule, insula, M1-M3 cortex): 7 - Supraganglionic infarction (M4-M6 cortex): 3 Total score (0-10 with 10 being normal): 10 IMPRESSION: 1. No evidence of acute intracranial abnormality. 2. ASPECTS is 10. 3. Mild chronic small vessel ischemic disease. These results were called by telephone at the time of interpretation on 05/07/2019 at 1:00 pm to Dr. Aletta Edouard , who verbally acknowledged these results. Electronically Signed   By: Logan Bores M.D.   On: 05/07/2019 13:01    Assessment: 68 y.o. male with transient right hand weakness lasting for 10 minutes in association with bifrontal headache, followed by left retroorbital aching sensation, resolved.  1. MRI here showed mild diffuse DWI hyperintensity in the left caudate body that overlaps the adjacent white matter. It was read as possibly representing cytotoxic edema from seizure activity versus a small stroke, however, the appearance is more consistent with artifact upon my review of the images, given no corresponding ADC map abnormality and no  corresponding hyperintensity on FLAIR or T2, as well as the unusual shape/distribution of the apparent signal change on DWI.  2. DDx primarily consists of TIA versus new onset complicated migraine. Given that he has risk factors for stroke and no history of migraines, TIA is felt to be more likely. No symptoms of jerking, twitching or confusion to suggest a seizure.  3. Stroke Risk Factors - DM and HTN  Recommendations: 1. HgbA1c, fasting lipid panel 2. MRA of the brain without contrast 3. PT consult, OT consult, Speech consult 4. Echocardiogram 5. Carotid dopplers 6. Prophylactic therapy- Has been started on ASA. Has an ASA "allergy" listed in Epic which was actually a side effect of increased symptoms of GERD when he was on ASA in the remote past. If GERD symptoms recur, he may need to be switched to Plavix 7. Risk factor modification 8. Telemetry monitoring 9. Frequent neuro checks 10. Permissive HTN x 24 hours.   @Electronically  signed: Dr. Kerney Elbe 05/07/2019, 9:58 PM

## 2019-05-07 NOTE — ED Notes (Signed)
Onset around 0930 this am ha, blurred vision  Rt arm weakness  On arrival no slurred speech, no facial droop, no weakness noted  Pt states still has a ha but states every thing has cleared,  Some generalized weakness

## 2019-05-07 NOTE — ED Notes (Signed)
Patient's daughter, Nira Conn, is updated on patient's room assignment. Report given to Ailene Ravel, Therapist, sports.

## 2019-05-07 NOTE — ED Triage Notes (Signed)
Pt c/o HA above left eye, blurred vision and right hand weakness "could hardly type"-sx started ~9-930am-states sx 15-30 min then resolved-cont'd "little bit of a HA"-nausea started en route to ED-pt NAD-to triage in w/c

## 2019-05-07 NOTE — ED Notes (Signed)
Patient transported to CT 

## 2019-05-07 NOTE — ED Notes (Signed)
Henry Bridges, patient's daughter is informed that patient is on his way to [facility] with [facility].

## 2019-05-07 NOTE — H&P (Signed)
History and Physical    Henry Bridges VPX:[id number] DOB: [birth year] DOA: 05/07/2019  PCP: Hulan Fess, MD   Patient coming from: Home   Chief Complaint: Headache, blurred vision, right arm weakness   HPI: Henry Bridges is a 68 y.o. male with medical history significant for tongue cancer status post remote resection and radiation, hypertension, type 2 diabetes mellitus, GERD, and hypothyroidism, now presenting to the emergency department for evaluation of headache, blurred vision, and right arm weakness.  Patient reports that he woke in his usual state of health this morning, but at approximately 9 or 9:30 AM, he developed a mild left frontal headache, followed shortly by blurred vision and right arm weakness.  The vision problems and weakness resolved within 30 minutes and the headache persisted a little while longer but has since resolved.  Patient has been working longer hours than usual lately, but had otherwise been in his usual state.  He denies any chest pain or palpitations, does not get headaches frequently, has never had these type of symptoms previously, and denies any recent fevers, chills, shortness of breath, or cough.  [facility] ED Course: Upon arrival to the ED, patient is found to be afebrile, saturating well on room air, bradycardic in the 50s, and with stable blood pressure.  EKG features sinus bradycardia with rate 52 and noncontrast head CT is negative for acute intracranial abnormality.  Chemistry panel features a glucose of 171 and creatinine 1.08.  CBC is unremarkable.  COVID-19 (Cephied) testing is negative.  Hospitalists were consulted for admission and the patient was transferred to [facility].  Review of Systems:  All other systems reviewed and apart from HPI, are negative.  Past Medical History:  Diagnosis Date   Diabetes mellitus without complication (HCC)    GERD (gastroesophageal reflux disease)    Hypertension    Hypothyroidism     Tongue cancer ([place])     Past Surgical History:  Procedure Laterality Date   bone spur     TONGUE SURGERY       reports that he has never smoked. He has never used smokeless tobacco. He reports previous alcohol use. No history on file for drug.  Allergies  Allergen Reactions   Aspirin     History reviewed. No pertinent family history.   Prior to Admission medications   Not on File    Physical Exam: Vitals:   05/07/19 1500 05/07/19 1530 05/07/19 1830 05/07/19 1946  BP: (!) 142/72 129/63 138/64 134/62  Pulse: 70 (!) 55 (!) 54 (!) 48  Resp: 17 13 16 11   Temp:   (!) 97.4 F (36.3 C) 98 F (36.7 C)  TempSrc:   Oral Oral  SpO2: 100% 98% 98% 98%  Weight:    105 kg  Height:    5\' 10"  (1.778 m)    Constitutional: NAD, calm  Eyes: PERTLA, lids and conjunctivae normal ENMT: Mucous membranes are moist. Posterior pharynx clear of any exudate or lesions.   Neck: normal, supple, no masses, no thyromegaly Respiratory: clear to auscultation bilaterally, no wheezing, no crackles. No accessory muscle use.  Cardiovascular: S1 & S2 heard, regular rate and rhythm. No extremity edema.  Abdomen: No distension, no tenderness, soft. Bowel sounds active.  Musculoskeletal: no clubbing / cyanosis. No joint deformity upper and lower extremities.  Skin: no significant rashes, lesions, ulcers. Warm, dry, well-perfused. Neurologic: CN 2-12 grossly intact. Sensation intact, DTR normal. Strength 5/5 in all 4 limbs.  Psychiatric: Alert and oriented  x 3. Very pleasant, cooperative.    Labs on Admission: I have personally reviewed following labs and imaging studies  CBC: Recent Labs  Lab 05/07/19 1237  WBC 6.1  NEUTROABS 4.8  HGB 13.8  HCT 41.7  MCV 88.7  PLT 332   Basic Metabolic Panel: Recent Labs  Lab 05/07/19 1237  NA 138  K 3.7  CL 103  CO2 23  GLUCOSE 171*  BUN 18  CREATININE 1.08  CALCIUM 9.4   GFR: Estimated Creatinine Clearance: 81.7 mL/min (by C-G formula based on  SCr of 1.08 mg/dL). Liver Function Tests: Recent Labs  Lab 05/07/19 1237  AST 23  ALT 24  ALKPHOS 73  BILITOT 0.9  PROT 7.6  ALBUMIN 4.3   No results for input(s): LIPASE, AMYLASE in the last 168 hours. No results for input(s): AMMONIA in the last 168 hours. Coagulation Profile: Recent Labs  Lab 05/07/19 1237  INR 0.9   Cardiac Enzymes: No results for input(s): CKTOTAL, CKMB, CKMBINDEX, TROPONINI in the last 168 hours. BNP (last 3 results) No results for input(s): PROBNP in the last 8760 hours. HbA1C: No results for input(s): HGBA1C in the last 72 hours. CBG: Recent Labs  Lab 05/07/19 1216  GLUCAP 161*   Lipid Profile: No results for input(s): CHOL, HDL, LDLCALC, TRIG, CHOLHDL, LDLDIRECT in the last 72 hours. Thyroid Function Tests: No results for input(s): TSH, T4TOTAL, FREET4, T3FREE, THYROIDAB in the last 72 hours. Anemia Panel: No results for input(s): VITAMINB12, FOLATE, FERRITIN, TIBC, IRON, RETICCTPCT in the last 72 hours. Urine analysis: No results found for: COLORURINE, APPEARANCEUR, LABSPEC, PHURINE, GLUCOSEU, HGBUR, BILIRUBINUR, KETONESUR, PROTEINUR, UROBILINOGEN, NITRITE, LEUKOCYTESUR Sepsis Labs: @LABRCNTIP (procalcitonin:4,lacticidven:4) ) Recent Results (from the past 240 hour(s))  SARS Coronavirus 2 (CEPHEID - Performed in [place] hospital lab), Hosp Order     Status: None   Collection Time: 05/07/19 12:55 PM   Specimen: Nasopharyngeal Swab  Result Value Ref Range Status   SARS Coronavirus 2 NEGATIVE NEGATIVE Final    Comment: (NOTE) If result is NEGATIVE SARS-CoV-2 target nucleic acids are NOT DETECTED. The SARS-CoV-2 RNA is generally detectable in upper and lower  respiratory specimens during the acute phase of infection. The lowest  concentration of SARS-CoV-2 viral copies this assay can detect is 250  copies / mL. A negative result does not preclude SARS-CoV-2 infection  and should not be used as the sole basis for treatment or other    patient management decisions.  A negative result may occur with  improper specimen collection / handling, submission of specimen other  than nasopharyngeal swab, presence of viral mutation(s) within the  areas targeted by this assay, and inadequate number of viral copies  (<250 copies / mL). A negative result must be combined with clinical  observations, patient history, and epidemiological information. If result is POSITIVE SARS-CoV-2 target nucleic acids are DETECTED. The SARS-CoV-2 RNA is generally detectable in upper and lower  respiratory specimens dur ing the acute phase of infection.  Positive  results are indicative of active infection with SARS-CoV-2.  Clinical  correlation with patient history and other diagnostic information is  necessary to determine patient infection status.  Positive results do  not rule out bacterial infection or co-infection with other viruses. If result is PRESUMPTIVE POSTIVE SARS-CoV-2 nucleic acids MAY BE PRESENT.   A presumptive positive result was obtained on the submitted specimen  and confirmed on repeat testing.  While 2019 novel coronavirus  (SARS-CoV-2) nucleic acids may be present in the submitted sample  additional confirmatory testing may be necessary for epidemiological  and / or clinical management purposes  to differentiate between  SARS-CoV-2 and other Sarbecovirus currently known to infect humans.  If clinically indicated additional testing with an alternate test  methodology [id number]) is advised. The SARS-CoV-2 RNA is generally  detectable in upper and lower respiratory sp ecimens during the acute  phase of infection. The expected result is Negative. Fact Sheet for Patients:  StrictlyIdeas.no Fact Sheet for Healthcare Providers: BankingDealers.co.za This test is not yet approved or cleared by the [place] FDA and has been authorized for detection and/or diagnosis of SARS-CoV-2  by FDA under an Emergency Use Authorization (EUA).  This EUA will remain in effect (meaning this test can be used) for the duration of the COVID-19 declaration under Section 564(b)(1) of the Act, 21 U.S.C. section 360bbb-3(b)(1), unless the authorization is terminated or revoked sooner. Performed at [facility], [address] [place], [place] [address]      Radiological Exams on Admission: Ct Head Code Stroke Wo Contrast  Result Date: 05/07/2019 CLINICAL DATA:  Code stroke. Headache above the left eye, blurred vision, and right hand weakness lasting 15-30 minutes. EXAM: CT HEAD WITHOUT CONTRAST TECHNIQUE: Contiguous axial images were obtained from the base of the skull through the vertex without intravenous contrast. COMPARISON:  None. FINDINGS: Brain: There is no evidence of acute infarct, intracranial hemorrhage, mass, midline shift, or extra-axial fluid collection. The ventricles and sulci are within normal limits for age. Patchy hypodensities in the cerebral white matter bilaterally are nonspecific but compatible with mild chronic small vessel ischemic disease. Vascular: No hyperdense vessel. Skull: No fracture or suspicious osseous lesion. Sinuses/Orbits: Visualized paranasal sinuses and mastoid air cells are clear. Visualized orbits are unremarkable. Other: None. ASPECTS [facility] Stroke Program Early CT Score) - Ganglionic level infarction (caudate, lentiform nuclei, internal capsule, insula, M1-M3 cortex): 7 - Supraganglionic infarction (M4-M6 cortex): 3 Total score (0-10 with 10 being normal): 10 IMPRESSION: 1. No evidence of acute intracranial abnormality. 2. ASPECTS is 10. 3. Mild chronic small vessel ischemic disease. These results were called by telephone at the time of interpretation on 05/07/2019 at 1:00 pm to Dr. Aletta Edouard , who verbally acknowledged these results. Electronically Signed   By: Logan Bores M.D.   On: 05/07/2019 13:01    EKG: Independently reviewed.  Sinus bradycardia (rate 52).   Assessment/Plan   1. Transient neurologic deficits  - Presents following an episode of left frontal HA, blurred vision, and right arm weakness that began ~9 am and resolved within 30 minutes  - Head CT is negative for acute findings and no focal deficits detected on exam  - Neurology is consulting and much appreciated  - Continue cardiac monitoring, frequent neuro checks, PT/OT/SLP consultation  - Follow-up neurology recommendations, for now will plan to check MRI brain, carotid US, echocardiogram, fasting lipids, and A1c, and start ASA if no contraindication    2. Type II DM  - No A1c on file   - Managed with metformin at home, held on admission  - Check CBG's and use a SSI with Novolog while in hospital   3. Hypertension  - BP at goal, hold antihypertensives initially given concern for acute ischemic CVA/TIA    4. Hypothyroidism  - Continue levothyroxine    5. Hx of tongue cancer  - Status-post resection and radiation in 1999, no recurrence per pt report    PPE: Mask, face shield  DVT prophylaxis: Lovenox  Code  Status: Full  Family Communication: Discussed with patient  Consults called: Neurology  Admission status: Observation     Vianne Bulls, MD [facility] Pager [phone]  If 7PM-7AM, please contact night-coverage [email or web address] Password [facility]  05/07/2019, 9:50 PM

## 2019-05-08 ENCOUNTER — Observation Stay (HOSPITAL_COMMUNITY): Payer: BC Managed Care – PPO

## 2019-05-08 ENCOUNTER — Observation Stay (HOSPITAL_BASED_OUTPATIENT_CLINIC_OR_DEPARTMENT_OTHER): Payer: BC Managed Care – PPO

## 2019-05-08 DIAGNOSIS — I361 Nonrheumatic tricuspid (valve) insufficiency: Secondary | ICD-10-CM | POA: Diagnosis not present

## 2019-05-08 DIAGNOSIS — I340 Nonrheumatic mitral (valve) insufficiency: Secondary | ICD-10-CM | POA: Diagnosis not present

## 2019-05-08 DIAGNOSIS — G459 Transient cerebral ischemic attack, unspecified: Secondary | ICD-10-CM

## 2019-05-08 DIAGNOSIS — E119 Type 2 diabetes mellitus without complications: Secondary | ICD-10-CM | POA: Diagnosis not present

## 2019-05-08 DIAGNOSIS — I6521 Occlusion and stenosis of right carotid artery: Secondary | ICD-10-CM | POA: Diagnosis not present

## 2019-05-08 LAB — LIPID PANEL
Cholesterol: 196 mg/dL (ref 0–200)
HDL: 59 mg/dL (ref >40)
LDL Cholesterol: 120 mg/dL — ABNORMAL HIGH (ref 0–99)
Total CHOL/HDL Ratio: 3.3 RATIO
Triglycerides: 87 mg/dL (ref <150)
VLDL: 17 mg/dL (ref 0–40)

## 2019-05-08 LAB — GLUCOSE, CAPILLARY
Glucose-Capillary: 114 mg/dL — ABNORMAL HIGH (ref 70–99)
Glucose-Capillary: 126 mg/dL — ABNORMAL HIGH (ref 70–99)
Glucose-Capillary: 137 mg/dL — ABNORMAL HIGH (ref 70–99)
Glucose-Capillary: 139 mg/dL — ABNORMAL HIGH (ref 70–99)
Glucose-Capillary: 175 mg/dL — ABNORMAL HIGH (ref 70–99)

## 2019-05-08 LAB — ECHOCARDIOGRAM COMPLETE
Height: 70 in
Weight: 3703.73 oz

## 2019-05-08 LAB — CBC
HCT: 39.2 % (ref 39.0–52.0)
Hemoglobin: 13.1 g/dL (ref 13.0–17.0)
MCH: 29.3 pg (ref 26.0–34.0)
MCHC: 33.4 g/dL (ref 30.0–36.0)
MCV: 87.7 fL (ref 80.0–100.0)
Platelets: 182 10*3/uL (ref 150–400)
RBC: 4.47 MIL/uL (ref 4.22–5.81)
RDW: 12.6 % (ref 11.5–15.5)
WBC: 5.5 10*3/uL (ref 4.0–10.5)
nRBC: 0 % (ref 0.0–0.2)

## 2019-05-08 LAB — HIV ANTIBODY (ROUTINE TESTING W REFLEX): HIV Screen 4th Generation wRfx: NONREACTIVE

## 2019-05-08 LAB — HEMOGLOBIN A1C
Hgb A1c MFr Bld: 6.8 % — ABNORMAL HIGH (ref 4.8–5.6)
Mean Plasma Glucose: 148.46 mg/dL

## 2019-05-08 MED ORDER — IOHEXOL 350 MG/ML SOLN
100.0000 mL | Freq: Once | INTRAVENOUS | Status: AC | PRN
  Administered 2019-05-08: 100 mL via INTRAVENOUS

## 2019-05-08 MED ORDER — ASPIRIN EC 81 MG PO TBEC
81.0000 mg | DELAYED_RELEASE_TABLET | Freq: Every day | ORAL | Status: DC
  Administered 2019-05-09: 81 mg via ORAL
  Filled 2019-05-08: qty 1

## 2019-05-08 MED ORDER — SODIUM CHLORIDE 0.9 % IV SOLN
INTRAVENOUS | Status: AC
  Administered 2019-05-08: 16:00:00 via INTRAVENOUS

## 2019-05-08 MED ORDER — ATORVASTATIN CALCIUM 40 MG PO TABS
40.0000 mg | ORAL_TABLET | Freq: Every day | ORAL | Status: DC
  Administered 2019-05-08 – 2019-05-09 (×2): 40 mg via ORAL
  Filled 2019-05-08: qty 1

## 2019-05-08 MED ORDER — GADOBUTROL 1 MMOL/ML IV SOLN
10.0000 mL | Freq: Once | INTRAVENOUS | Status: AC | PRN
  Administered 2019-05-08: 10 mL via INTRAVENOUS

## 2019-05-08 MED ORDER — CLOPIDOGREL BISULFATE 75 MG PO TABS
75.0000 mg | ORAL_TABLET | Freq: Every day | ORAL | Status: DC
  Administered 2019-05-09: 75 mg via ORAL
  Filled 2019-05-08: qty 1

## 2019-05-08 NOTE — Procedures (Signed)
ELECTROENCEPHALOGRAM REPORT   Patient: Henry Bridges       Room #: 5T97F EEG No. ID: [id number] Age: 68 y.o.        Sex: male Referring Physician: Ghimire Report Date:  05/08/2019        Interpreting Physician: Alexis Goodell  History: RONTAVIOUS ALBRIGHT is an 68 y.o. male with right sided weakness and headache  Medications:  ASA, Lipitor, Insulin, Synthroid  Conditions of Recording:  This is a 21 channel routine scalp EEG performed with bipolar and monopolar montages arranged in accordance to the international 10/20 system of electrode placement. One channel was dedicated to EKG recording.  The patient is in the awake, drowsy and asleep states.  Description:  The waking background activity consists of a low voltage, symmetrical, fairly well organized, 9 Hz alpha activity, seen from the parieto-occipital and posterior temporal regions.  Low voltage fast activity, poorly organized, is seen anteriorly and is at times superimposed on more posterior regions.  A mixture of theta and alpha rhythms are seen from the central and temporal regions. The patient drowses with slowing to irregular, low voltage theta and beta activity.   The patient goes in to a light sleep briefly with symmetrical sleep spindles, vertex central sharp transients and irregular slow activity. No epileptiform activity is noted.   Hyperventilation and intermittent photic stimulation were not performed.  IMPRESSION: Normal electroencephalogram, awake and asleep. There are no focal lateralizing or epileptiform features.   Alexis Goodell, MD Neurology [phone] 05/08/2019, 11:52 AM

## 2019-05-08 NOTE — Evaluation (Signed)
Physical Therapy Evaluation Patient Details Name: Henry Bridges MRN: [id number] DOB: [birth year] Today's Date: 05/08/2019   History of Present Illness  Patient is a 68 y/o male presenting to the ED on 05/07/2019 with primary complaints of blured vision and R UE weakness. CT is negative for acute intracranial abnormality. Past medical history significant for tongue cancer status post remote resection and radiation, hypertension, type 2 diabetes mellitus, GERD, and hypothyroidism.    Clinical Impression  Patient admitted with the above. Patient reports independence at baseline. Patient today performing all functional mobility at Mod I/IND level without need for physical assist for steadying or mobility. Patient ambulating in hallway and up/down steps without issue. Patient reports feeling back to baseline. No further acute PT needs identified. PT to sign off.     Follow Up Recommendations No PT follow up    Equipment Recommendations  None recommended by PT    Recommendations for Other Services       Precautions / Restrictions Precautions Precautions: None Restrictions Weight Bearing Restrictions: No      Mobility  Bed Mobility Overal bed mobility: Independent                Transfers Overall transfer level: Independent Equipment used: None                Ambulation/Gait Ambulation/Gait assistance: Supervision;Modified independent (Device/Increase time) Gait Distance (Feet): 300 Feet Assistive device: None Gait Pattern/deviations: Step-through pattern Gait velocity: WNL   General Gait Details: steady pace of gait; no deficits noted  Stairs            Wheelchair Mobility    Modified Rankin (Stroke Patients Only) Modified Rankin (Stroke Patients Only) Pre-Morbid Rankin Score: No symptoms Modified Rankin: No symptoms     Balance Overall balance assessment: Independent                               Standardized Balance  Assessment Standardized Balance Assessment : Dynamic Gait Index   Dynamic Gait Index Level Surface: Normal Change in Gait Speed: Normal Gait with Horizontal Head Turns: Mild Impairment Gait with Vertical Head Turns: Normal Gait and Pivot Turn: Normal Step Over Obstacle: Normal Step Around Obstacles: Normal Steps: Normal Total Score: 23       Pertinent Vitals/Pain Pain Assessment: No/denies pain    Home Living Family/patient expects to be discharged to:: Private residence Living Arrangements: Spouse/significant other;Children Available Help at Discharge: Family;Available 24 hours/day Type of Home: House Home Access: Stairs to enter Entrance Stairs-Rails: Right Entrance Stairs-Number of Steps: 5 Home Layout: One level Home Equipment: None      Prior Function Level of Independence: Independent               Hand Dominance        Extremity/Trunk Assessment   Upper Extremity Assessment Upper Extremity Assessment: Overall WFL for tasks assessed    Lower Extremity Assessment Lower Extremity Assessment: Overall WFL for tasks assessed    Cervical / Trunk Assessment Cervical / Trunk Assessment: Normal  Communication   Communication: No difficulties  Cognition Arousal/Alertness: Awake/alert Behavior During Therapy: WFL for tasks assessed/performed Overall Cognitive Status: Within Functional Limits for tasks assessed                                        General Comments  Exercises     Assessment/Plan    PT Assessment Patent does not need any further PT services  PT Problem List         PT Treatment Interventions      PT Goals (Current goals can be found in the Care Plan section)  Acute Rehab PT Goals Patient Stated Goal: return home today PT Goal Formulation: All assessment and education complete, DC therapy    Frequency     Barriers to discharge        Co-evaluation               AM-PAC PT "6 Clicks"  Mobility  Outcome Measure Help needed turning from your back to your side while in a flat bed without using bedrails?: None Help needed moving from lying on your back to sitting on the side of a flat bed without using bedrails?: None Help needed moving to and from a bed to a chair (including a wheelchair)?: None Help needed standing up from a chair using your arms (e.g., wheelchair or bedside chair)?: None Help needed to walk in hospital room?: None Help needed climbing 3-5 steps with a railing? : None 6 Click Score: 24    End of Session Equipment Utilized During Treatment: Gait belt Activity Tolerance: Patient tolerated treatment well Patient left: in chair;with call bell/phone within reach Nurse Communication: Mobility status PT Visit Diagnosis: Unsteadiness on feet (R26.81)    Time: 6945-0388 PT Time Calculation (min) (ACUTE ONLY): 21 min   Charges:   PT Evaluation $PT Eval Low Complexity: 1 Low           Lanney Gins, PT, DPT Supplemental Physical Therapist 05/08/19 8:59 AM Pager: [phone] Office: [phone]

## 2019-05-08 NOTE — Progress Notes (Signed)
SLP Cancellation Note  Patient Details Name: ERIE RADU MRN: [id number] DOB: 18-May-1951   Cancelled treatment:       Reason Eval/Treat Not Completed: Patient at procedure or test/unavailable(Pt was approached for SLP evaluation but is currently with EEG tech having EEG done. SLP will follow up)  Neilan Rizzo I. Hardin Negus, [place], [place] Office number [phone] Pager [facility] 05/08/2019, 10:15 AM

## 2019-05-08 NOTE — Progress Notes (Signed)
Carotid duplex has been completed.   Preliminary results in CV Proc.   Abram Sander 05/08/2019 2:35 PM

## 2019-05-08 NOTE — Progress Notes (Signed)
PROGRESS NOTE    [facility]  WOE:[id number] DOB: [birth year] DOA: 05/07/2019 PCP: Hulan Fess, MD    Brief Narrative:  68 year old gentleman with history of tongue cancer status post remote resection and radiation, hypertension, type 2 diabetes, GERD and hypothyroidism who presented to the emergency room with sudden onset of global headache, blurred vision and right arm weakness.  He started with mild left frontal headache followed by blurred vision and right arm weakness.  Most of the symptoms improved within 30 minutes.  In the emergency room he was afebrile.  On room air.  Sinus bradycardic.  A stable blood pressures.  Noncontrast head CT was negative for any acute findings.  Chemistry panel was normal.  COVID-19 was negative.  Admitted and underwent evaluation for TIA.   Assessment & Plan:   Principal Problem:   TIA (transient ischemic attack) Active Problems:   Hypertension   Diabetes mellitus without complication (HCC)   Hypothyroidism  TIA with amaurosis fugax: Continue to monitor Neurochecks and vital signs as per stroke protocol. Patient was not a TPA and vascular intervention candidate because of improved symptoms on arrival. Antiplatelet, aspirin 325 mg daily.  Caused acidity but no other side effects. Statin, Lipitor 40 mg daily, LDL 120, A1c 6.8 Blood pressures on goals. Seen by speech, PT OT, no remaining deficits. MRI brain was abnormal, MRI brain with contrast shows no acute findings. 2D echocardiogram was normal.  EEG was normal. Carotid duplexes showed 60-79% left ICA stenosis. Followed by neurology. Continue IV fluid hydration, ordered CTA of the head and neck, if significant stenosis, will consult vascular surgery.  Diabetes type 2: A1c 6.8.  On metformin at home.  Undergoing contrasted studies.  Keep on sliding scale insulin.  Will resume metformin in 48 hours.  Hypertension: Blood pressures on goal.  Blood pressure medications on hold  today.  Hypothyroidism: On Synthroid.   DVT prophylaxis: Lovenox subcu Code Status: Full code Family Communication: None Disposition Plan: Stay in the hospital to finish TIA work-up   Consultants:   Neurology  Procedures:   EEG normal  Antimicrobials:   None   Subjective: Patient seen and examined.  No overnight events.  Telemetry shows some sinus bradycardia, not on any rate control medications. Blurry vision and weakness resolved.  Objective: Vitals:   05/08/19 0233 05/08/19 0432 05/08/19 0900 05/08/19 1300  BP: (!) 111/52 (!) 103/51 128/69 (!) 164/75  Pulse: (!) 41 (!) 45 (!) 54 (!) 50  Resp: 14 16 16 12   Temp:  97.9 F (36.6 C) 98.2 F (36.8 C) 98.1 F (36.7 C)  TempSrc:  Oral Oral Oral  SpO2: 94% 96% 97% 98%  Weight:      Height:        Intake/Output Summary (Last 24 hours) at 05/08/2019 1630 Last data filed at 05/08/2019 1500 Gross per 24 hour  Intake 1037.41 ml  Output 500 ml  Net 537.41 ml   Filed Weights   05/07/19 1946  Weight: 105 kg    Examination:  General exam: Appears calm and comfortable  Respiratory system: Clear to auscultation. Respiratory effort normal. Cardiovascular system: S1 & S2 heard, RRR. No JVD, murmurs, rubs, gallops or clicks. No pedal edema. Gastrointestinal system: Abdomen is nondistended, soft and nontender. No organomegaly or masses felt. Normal bowel sounds heard. Central nervous system: Alert and oriented. No focal neurological deficits. Extremities: Symmetric 5 x 5 power. Skin: No rashes, lesions or ulcers Psychiatry: Judgement and insight appear normal. Mood & affect appropriate.  Data Reviewed: I have personally reviewed following labs and imaging studies  CBC: Recent Labs  Lab 05/07/19 1237 05/08/19 0414  WBC 6.1 5.5  NEUTROABS 4.8  --   HGB 13.8 13.1  HCT 41.7 39.2  MCV 88.7 87.7  PLT 193 829   Basic Metabolic Panel: Recent Labs  Lab 05/07/19 1237  NA 138  K 3.7  CL 103  CO2 23   GLUCOSE 171*  BUN 18  CREATININE 1.08  CALCIUM 9.4   GFR: Estimated Creatinine Clearance: 81.7 mL/min (by C-G formula based on SCr of 1.08 mg/dL). Liver Function Tests: Recent Labs  Lab 05/07/19 1237  AST 23  ALT 24  ALKPHOS 73  BILITOT 0.9  PROT 7.6  ALBUMIN 4.3   No results for input(s): LIPASE, AMYLASE in the last 168 hours. No results for input(s): AMMONIA in the last 168 hours. Coagulation Profile: Recent Labs  Lab 05/07/19 1237  INR 0.9   Cardiac Enzymes: No results for input(s): CKTOTAL, CKMB, CKMBINDEX, TROPONINI in the last 168 hours. BNP (last 3 results) No results for input(s): PROBNP in the last 8760 hours. HbA1C: Recent Labs    05/08/19 0414  HGBA1C 6.8*   CBG: Recent Labs  Lab 05/07/19 1216 05/08/19 0023 05/08/19 0625 05/08/19 1356  GLUCAP 161* 139* 126* 114*   Lipid Profile: Recent Labs    05/08/19 0414  CHOL 196  HDL 59  LDLCALC 120*  TRIG 87  CHOLHDL 3.3   Thyroid Function Tests: No results for input(s): TSH, T4TOTAL, FREET4, T3FREE, THYROIDAB in the last 72 hours. Anemia Panel: No results for input(s): VITAMINB12, FOLATE, FERRITIN, TIBC, IRON, RETICCTPCT in the last 72 hours. Sepsis Labs: No results for input(s): PROCALCITON, LATICACIDVEN in the last 168 hours.  Recent Results (from the past 240 hour(s))  SARS Coronavirus 2 (CEPHEID - Performed in [place] hospital lab), Hosp Order     Status: None   Collection Time: 05/07/19 12:55 PM   Specimen: Nasopharyngeal Swab  Result Value Ref Range Status   SARS Coronavirus 2 NEGATIVE NEGATIVE Final    Comment: (NOTE) If result is NEGATIVE SARS-CoV-2 target nucleic acids are NOT DETECTED. The SARS-CoV-2 RNA is generally detectable in upper and lower  respiratory specimens during the acute phase of infection. The lowest  concentration of SARS-CoV-2 viral copies this assay can detect is 250  copies / mL. A negative result does not preclude SARS-CoV-2 infection  and should not be  used as the sole basis for treatment or other  patient management decisions.  A negative result may occur with  improper specimen collection / handling, submission of specimen other  than nasopharyngeal swab, presence of viral mutation(s) within the  areas targeted by this assay, and inadequate number of viral copies  (<250 copies / mL). A negative result must be combined with clinical  observations, patient history, and epidemiological information. If result is POSITIVE SARS-CoV-2 target nucleic acids are DETECTED. The SARS-CoV-2 RNA is generally detectable in upper and lower  respiratory specimens dur ing the acute phase of infection.  Positive  results are indicative of active infection with SARS-CoV-2.  Clinical  correlation with patient history and other diagnostic information is  necessary to determine patient infection status.  Positive results do  not rule out bacterial infection or co-infection with other viruses. If result is PRESUMPTIVE POSTIVE SARS-CoV-2 nucleic acids MAY BE PRESENT.   A presumptive positive result was obtained on the submitted specimen  and confirmed on repeat testing.  While 2019 novel  coronavirus  (SARS-CoV-2) nucleic acids may be present in the submitted sample  additional confirmatory testing may be necessary for epidemiological  and / or clinical management purposes  to differentiate between  SARS-CoV-2 and other Sarbecovirus currently known to infect humans.  If clinically indicated additional testing with an alternate test  methodology [id number]) is advised. The SARS-CoV-2 RNA is generally  detectable in upper and lower respiratory sp ecimens during the acute  phase of infection. The expected result is Negative. Fact Sheet for Patients:  StrictlyIdeas.no Fact Sheet for Healthcare Providers: BankingDealers.co.za This test is not yet approved or cleared by the [place] FDA and has been authorized  for detection and/or diagnosis of SARS-CoV-2 by FDA under an Emergency Use Authorization (EUA).  This EUA will remain in effect (meaning this test can be used) for the duration of the COVID-19 declaration under Section 564(b)(1) of the Act, 21 U.S.C. section 360bbb-3(b)(1), unless the authorization is terminated or revoked sooner. Performed at [facility], [address] [place], [place] [address]          Radiology Studies: Mr Brain 51 Contrast  Result Date: 05/08/2019 CLINICAL DATA:  68 y/o M; headache, blurred vision, right arm weakness. TIA, initial exam. History of tongue cancer post resection and radiation. EXAM: MRI HEAD WITHOUT CONTRAST TECHNIQUE: Multiplanar, multiecho pulse sequences of the brain and surrounding structures were obtained without intravenous contrast. COMPARISON:  05/07/2019 CT head. FINDINGS: Brain: Mildly increased diffusion signal and mildly decreased ADC of the left caudate nucleus head and body on the axial diffusion sequence (series 8, image 30) and to lesser degree on the coronal diffusion sequence (series 1210, image 22). No associated hemorrhage or mass effect. On the axial DWI sequence there is also increased signal within the left anterior cingulate gyrus, right insula, and right hippocampus, however, this is not replicated on the coronal sequences and may be artifactual. No additional corresponding signal abnormality on other sequences. No acute hemorrhage, hydrocephalus, extra-axial collection, or mass effect. Scattered punctate nonspecific T2 FLAIR hyperintensities in subcortical and periventricular white matter are compatible with mild chronic microvascular ischemic changes. Mild volume loss of the brain. Vascular: Normal flow voids. Skull and upper cervical spine: Normal marrow signal. Sinuses/Orbits: Negative. Other: None. IMPRESSION: Mildly decreased diffusion of the left caudate nucleus. Findings may represent acute/early subacute infarction,  however, the distribution is somewhat atypical for lacunar infarction raising the question of seizure related activity or metabolic/toxic injury. Electronically Signed   By: Kristine Garbe M.D.   On: 05/08/2019 00:24   Mr Jeri Cos YJ Contrast  Result Date: 05/08/2019 CLINICAL DATA:  TIA.  Hand weakness.  History of tongue cancer EXAM: MRI HEAD WITHOUT AND WITH CONTRAST TECHNIQUE: Multiplanar, multiecho pulse sequences of the brain and surrounding structures were obtained without and with intravenous contrast. CONTRAST:  10 mL Gadovist IV COMPARISON:  MRI head 05/07/2019 FINDINGS: Brain: Negative for acute infarct. Previously noted signal in the left caudate on axial diffusion imaging no longer present and likely was artifact. Different machines utilized on the 2 studies. Ventricle size normal. Scattered small white matter hyperintensities bilaterally appear chronic. Negative for hemorrhage mass or fluid collection. No midline shift. Normal enhancement. No evidence of metastatic disease. No abnormal enhancement in the left caudate region. Vascular: Normal arterial flow voids Skull and upper cervical spine: Negative Sinuses/Orbits: Negative Other: None IMPRESSION: No acute abnormality Diffusion-weighted signal left caudate on yesterday's study appears to be artifactual. Mild chronic white matter changes likely due to microvascular  ischemia. Electronically Signed   By: Franchot Gallo M.D.   On: 05/08/2019 13:16   Ct Head Code Stroke Wo Contrast  Result Date: 05/07/2019 CLINICAL DATA:  Code stroke. Headache above the left eye, blurred vision, and right hand weakness lasting 15-30 minutes. EXAM: CT HEAD WITHOUT CONTRAST TECHNIQUE: Contiguous axial images were obtained from the base of the skull through the vertex without intravenous contrast. COMPARISON:  None. FINDINGS: Brain: There is no evidence of acute infarct, intracranial hemorrhage, mass, midline shift, or extra-axial fluid collection. The  ventricles and sulci are within normal limits for age. Patchy hypodensities in the cerebral white matter bilaterally are nonspecific but compatible with mild chronic small vessel ischemic disease. Vascular: No hyperdense vessel. Skull: No fracture or suspicious osseous lesion. Sinuses/Orbits: Visualized paranasal sinuses and mastoid air cells are clear. Visualized orbits are unremarkable. Other: None. ASPECTS [facility] Stroke Program Early CT Score) - Ganglionic level infarction (caudate, lentiform nuclei, internal capsule, insula, M1-M3 cortex): 7 - Supraganglionic infarction (M4-M6 cortex): 3 Total score (0-10 with 10 being normal): 10 IMPRESSION: 1. No evidence of acute intracranial abnormality. 2. ASPECTS is 10. 3. Mild chronic small vessel ischemic disease. These results were called by telephone at the time of interpretation on 05/07/2019 at 1:00 pm to Dr. Aletta Edouard , who verbally acknowledged these results. Electronically Signed   By: Logan Bores M.D.   On: 05/07/2019 13:01   Vas US Carotid (at [facility] Only)  Result Date: 05/08/2019 Carotid Arterial Duplex Study Indications:       TIA. Risk Factors:      Hypertension. Comparison Study:  no prior Performing Technologist: Abram Sander RVS  Examination Guidelines: A complete evaluation includes B-mode imaging, spectral Doppler, color Doppler, and power Doppler as needed of all accessible portions of each vessel. Bilateral testing is considered an integral part of a complete examination. Limited examinations for reoccurring indications may be performed as noted.  Right Carotid Findings: +----------+--------+--------+--------+------------+--------+             PSV cm/s EDV cm/s Stenosis Describe     Comments  +----------+--------+--------+--------+------------+--------+  CCA Prox   85       16                heterogenous           +----------+--------+--------+--------+------------+--------+  CCA Distal 75       23                heterogenous            +----------+--------+--------+--------+------------+--------+  ICA Prox   81       27       1-39%    heterogenous           +----------+--------+--------+--------+------------+--------+  ICA Distal 63       22                                       +----------+--------+--------+--------+------------+--------+  ECA        72       7                                        +----------+--------+--------+--------+------------+--------+ +----------+--------+-------+--------+-------------------+             PSV cm/s EDV cms Describe Arm  Pressure (mmHG)  +----------+--------+-------+--------+-------------------+  Subclavian 100                                            +----------+--------+-------+--------+-------------------+ +---------+--------+--+--------+--+---------+  Vertebral PSV cm/s 37 EDV cm/s 10 Antegrade  +---------+--------+--+--------+--+---------+  Left Carotid Findings: +----------+--------+--------+--------+------------+--------+             PSV cm/s EDV cm/s Stenosis Describe     Comments  +----------+--------+--------+--------+------------+--------+  CCA Prox   73       16                                       +----------+--------+--------+--------+------------+--------+  CCA Distal 67       16                heterogenous           +----------+--------+--------+--------+------------+--------+  ICA Prox   237      83       60-79%   heterogenous           +----------+--------+--------+--------+------------+--------+  ICA Mid    166      61                                       +----------+--------+--------+--------+------------+--------+  ICA Distal 107      23                                       +----------+--------+--------+--------+------------+--------+  ECA        96       14                                       +----------+--------+--------+--------+------------+--------+ +----------+--------+--------+--------+-------------------+  Subclavian PSV cm/s EDV cm/s Describe Arm Pressure (mmHG)   +----------+--------+--------+--------+-------------------+             104                                             +----------+--------+--------+--------+-------------------+ +---------+--------+--+--------+-+---------+  Vertebral PSV cm/s 38 EDV cm/s 6 Antegrade  +---------+--------+--+--------+-+---------+  Summary: Right Carotid: Velocities in the right ICA are consistent with a 1-39% stenosis. Left Carotid: Velocities in the left ICA are consistent with a 60-79% stenosis. Vertebrals: Bilateral vertebral arteries demonstrate antegrade flow. *See table(s) above for measurements and observations.     Preliminary         Scheduled Meds:  aspirin  300 mg Rectal Daily   Or   aspirin  325 mg Oral Daily   atorvastatin  40 mg Oral q1800   enoxaparin (LOVENOX) injection  40 mg Subcutaneous Q24H   insulin aspart  0-5 Units Subcutaneous QHS   insulin aspart  0-9 Units Subcutaneous TID WC   levothyroxine  125 mcg Oral Q0600   pantoprazole  40 mg Oral Daily   sodium chloride flush  3 mL Intravenous Once   Continuous Infusions:  sodium chloride 100 mL/hr at 05/08/19 1628     LOS: 0 days    Time spent: 25 minutes    Barb Merino, MD [facility] Pager [phone]  If 7PM-7AM, please contact night-coverage [email or web address] Password [facility] 05/08/2019, 4:30 PM

## 2019-05-08 NOTE — Progress Notes (Signed)
2D Echocardiogram has been performed.  Henry Bridges 05/08/2019, 11:35 AM

## 2019-05-08 NOTE — Evaluation (Signed)
Occupational Therapy Evaluation Patient Details Name: Henry Bridges MRN: [id number] DOB: Jun 23, 1951 Today's Date: 05/08/2019    History of Present Illness Patient is a 68 y/o male presenting to the ED on 05/07/2019 with primary complaints of blured vision and R UE weakness. CT is negative for acute intracranial abnormality. Past medical history significant for tongue cancer status post remote resection and radiation, hypertension, type 2 diabetes mellitus, GERD, and hypothyroidism.   Clinical Impression   Pt PTA: independent with ADL and mobility. Pt currently with no focal deficits. Pt performing visual scanning and visual cancellation task performing with 100% accuracy. Pt with good strength and no balance deficits. Pt performing mobility and ADL tasks without difficulty. Pt does not require continued OT skilled services for ADL, mobility and safety. OT signing off.      Follow Up Recommendations  No OT follow up    Equipment Recommendations  None recommended by OT    Recommendations for Other Services       Precautions / Restrictions Precautions Precautions: None Restrictions Weight Bearing Restrictions: No      Mobility Bed Mobility Overal bed mobility: Independent             General bed mobility comments: up in chair upon arrival  Transfers Overall transfer level: Independent Equipment used: None                  Balance Overall balance assessment: Independent                               Standardized Balance Assessment Standardized Balance Assessment : Dynamic Gait Index   Dynamic Gait Index Level Surface: Normal Change in Gait Speed: Normal Gait with Horizontal Head Turns: Mild Impairment Gait with Vertical Head Turns: Normal Gait and Pivot Turn: Normal Step Over Obstacle: Normal Step Around Obstacles: Normal Steps: Normal Total Score: 23     ADL either performed or assessed with clinical judgement   ADL Overall ADL's :  At baseline                                       General ADL Comments: Pt independent with ADL and mobility. Pt with visual scanning and cancellation tasks with 100% accuracy.     Vision Baseline Vision/History: Wears glasses Wears Glasses: At all times Vision Assessment?: No apparent visual deficits     Perception     Praxis      Pertinent Vitals/Pain Pain Assessment: No/denies pain     Hand Dominance     Extremity/Trunk Assessment Upper Extremity Assessment Upper Extremity Assessment: Overall WFL for tasks assessed   Lower Extremity Assessment Lower Extremity Assessment: Overall WFL for tasks assessed   Cervical / Trunk Assessment Cervical / Trunk Assessment: Normal   Communication Communication Communication: No difficulties   Cognition Arousal/Alertness: Awake/alert Behavior During Therapy: WFL for tasks assessed/performed Overall Cognitive Status: Within Functional Limits for tasks assessed                                     General Comments  Pt with no focal deficits.    Exercises     Shoulder Instructions      Home Living Family/patient expects to be discharged to:: Private residence Living Arrangements: Spouse/significant other;Children Available Help  at Discharge: Family;Available 24 hours/day Type of Home: House Home Access: Stairs to enter [employer] of Steps: 5 Entrance Stairs-Rails: Right Home Layout: One level     Bathroom Shower/Tub: Occupational psychologist: Handicapped height     Home Equipment: None          Prior Functioning/Environment Level of Independence: Independent                 OT Problem List:        OT Treatment/Interventions:      OT Goals(Current goals can be found in the care plan section) Acute Rehab OT Goals Patient Stated Goal: return home today OT Goal Formulation: With patient  OT Frequency:     Barriers to D/C:             Co-evaluation              AM-PAC OT "6 Clicks" Daily Activity     Outcome Measure Help from another person eating meals?: None Help from another person taking care of personal grooming?: None Help from another person toileting, which includes using toliet, bedpan, or urinal?: None Help from another person bathing (including washing, rinsing, drying)?: None Help from another person to put on and taking off regular upper body clothing?: None Help from another person to put on and taking off regular lower body clothing?: None 6 Click Score: 24   End of Session Nurse Communication: Mobility status  Activity Tolerance: Patient tolerated treatment well Patient left: in chair;with call bell/phone within reach  OT Visit Diagnosis: Unsteadiness on feet (R26.81)                Time: 7939-6886 OT Time Calculation (min): 12 min Charges:  OT General Charges $OT Visit: 1 Visit OT Evaluation $OT Eval Moderate Complexity: 1 Mod  Darryl Nestle) Marsa Aris OTR/L Acute Rehabilitation Services Pager: [phone] Office: [place] 05/08/2019, 10:40 AM

## 2019-05-08 NOTE — Progress Notes (Signed)
SLP Cancellation Note  Patient Details Name: Henry [facility] MRN: [id number] DOB: 11-11-1950   Cancelled treatment:       Reason Eval/Treat Not Completed: Patient at procedure or test/unavailable(Pt off the floor at this time. SLP will follow up)  [name], [place], [place] Office number [phone] Pager [place] 05/08/2019, 12:11 PM

## 2019-05-08 NOTE — Progress Notes (Signed)
Patient's HR has been staying in the low 40's while asleep. Patient is asymptomatic without any complaint, and stated his HR is usually low. Hospitalist notified. Will keep monitoring.

## 2019-05-08 NOTE — TOC Transition Note (Signed)
Transition of Care [facility]) - CM/SW Discharge Note   Patient Details  Name: VITALY WANAT MRN: [id number] Date of Birth: 1951/07/07  Transition of Care [facility]) CM/SW Contact:  Geralynn Ochs, LCSW Phone Number: 05/08/2019, 12:12 PM   Clinical Narrative:   Chart reviewed; no TOC needs identified. Patient will discharge home with wife and self care when medically stable.       Barriers to Discharge: No Barriers Identified   Patient Goals and CMS Choice        Discharge Placement                       Discharge Plan and Services In-house Referral: NA Discharge Planning Services: NA Post Acute Care Choice: NA          DME Arranged: N/A DME Agency: NA       HH Arranged: NA HH Agency: NA        Social Determinants of Health (SDOH) Interventions     Readmission Risk Interventions No flowsheet data found.

## 2019-05-08 NOTE — Progress Notes (Signed)
EEG complete - results pending 

## 2019-05-08 NOTE — Evaluation (Signed)
Speech Language Pathology Evaluation Patient Details Name: Henry Bridges MRN: [id number] DOB: 03/27/1951 Today's Date: 05/08/2019 Time: 9767-3419 SLP Time Calculation (min) (ACUTE ONLY): 17 min  Problem List:  Patient Active Problem List   Diagnosis Date Noted  . TIA (transient ischemic attack) 05/07/2019  . Hypertension   . Diabetes mellitus without complication ([place])   . Hypothyroidism    Past Medical History:  Past Medical History:  Diagnosis Date  . Diabetes mellitus without complication ([place])   . GERD (gastroesophageal reflux disease)   . Hypertension   . Hypothyroidism   . Tongue cancer [facility])    Past Surgical History:  Past Surgical History:  Procedure Laterality Date  . bone spur    . TONGUE SURGERY     HPI:  Pt is a 68 y.o. male with medical history significant for tongue cancer status post remote resection and radiation, hypertension, type 2 diabetes mellitus, GERD, and hypothyroidism who presented to the emergency department for evaluation of headache, blurred vision, and right arm weakness. MRI of the brain revealed mildly decreased diffusion of the left caudate nucleus. Findings may represent acute/early subacute infarction, with somewhat atypical distribution for lacunar infarction raising the question of seizure related activity or metabolic/toxic injury.   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 He denied any baseline or new deficits in speech, language or cognition. His speech and language skills are currently within normal limits and no overt cognitive-linguistic deficits were noted. Further skilled SLP services are not clinically indicated at this time. Pt and nursing were educated regarding results and recommendations; both parties verbalized understanding as well as agreement with plan of care.    SLP Assessment  SLP Recommendation/Assessment:  Patient does not need any further Speech Lanaguage Pathology Services SLP Visit Diagnosis: Aphasia (R47.01)    Follow Up Recommendations  None    Frequency and Duration           SLP Evaluation Cognition  Overall Cognitive Status: Within Functional Limits for tasks assessed Arousal/Alertness: Awake/alert Orientation Level: Oriented X4 Attention: Focused;Sustained Focused Attention: Appears intact Sustained Attention: Appears intact Memory: Impaired Memory Impairment: Storage deficit;Retrieval deficit;Decreased recall of new information(Immediate: 3/3; delayed: 1/3; with cues: 2/2) Awareness: Appears intact Problem Solving: Appears intact       Comprehension  Auditory Comprehension Overall Auditory Comprehension: Appears within functional limits for tasks assessed Yes/No Questions: Within Functional Limits Basic Biographical Questions: (5/5) Complex Questions: (5/5) Paragraph Comprehension (via yes/no questions): (3/4) Commands: Within Functional Limits Two Step Basic Commands: (4/4) Multistep Basic Commands: (4/4) Conversation: Complex Visual Recognition/Discrimination Discrimination: Within Function Limits Reading Comprehension Reading Status: Within funtional limits    Expression Expression Primary Mode of Expression: Verbal Verbal Expression Overall Verbal Expression: Appears within functional limits for tasks assessed Initiation: No impairment Automatic Speech: Counting;Day of week;Month of year(WNL) Level of Generative/Spontaneous Verbalization: Conversation Repetition: No impairment(5/5) Naming: No impairment Responsive: (5/5) Confrontation: Within functional limits(5/5) Convergent: (Sentence completion: 5/5) Pragmatics: No impairment   Oral / Motor  Oral Motor/Sensory Function Overall Oral Motor/Sensory Function: Within functional limits Motor Speech Overall Motor Speech: Appears within functional limits for tasks assessed Respiration: Within  functional limits Phonation: Normal Resonance: Within functional limits Articulation: Within functional limitis Intelligibility: Intelligible Motor Planning: Witnin functional limits Motor Speech Errors: Not applicable   Henry Bridges, [place], [place] Office number [phone] Pager [phone]  Horton Marshall 05/08/2019, 2:37 PM

## 2019-05-08 NOTE — Progress Notes (Signed)
STROKE TEAM PROGRESS NOTE   INTERVAL HISTORY Pt sitting in chair. Symptoms all resolved.  Patient recounted HPI with me.  He had episode of right hand clumsiness, not able to hike for 10 minutes.  At same time he had a headache lasting 1-1/2 hours.  He denies any history of migraine or seizure.  Denies weakness of the leg or face, numbness or tingling, dizziness, or loss of consciousness.  Vitals:   05/08/19 0032 05/08/19 0233 05/08/19 0432 05/08/19 0900  BP: 132/66 (!) 111/52 (!) 103/51 128/69  Pulse: (!) 49 (!) 41 (!) 45 (!) 54  Resp: 12 14 16 16   Temp:   97.9 F (36.6 C) 98.2 F (36.8 C)  TempSrc:   Oral Oral  SpO2: 97% 94% 96% 97%  Weight:      Height:        CBC:  Recent Labs  Lab 05/07/19 1237 05/08/19 0414  WBC 6.1 5.5  NEUTROABS 4.8  --   HGB 13.8 13.1  HCT 41.7 39.2  MCV 88.7 87.7  PLT 193 474    Basic Metabolic Panel:  Recent Labs  Lab 05/07/19 1237  NA 138  K 3.7  CL 103  CO2 23  GLUCOSE 171*  BUN 18  CREATININE 1.08  CALCIUM 9.4   Lipid Panel:     Component Value Date/Time   CHOL 196 05/08/2019 0414   TRIG 87 05/08/2019 0414   HDL 59 05/08/2019 0414   CHOLHDL 3.3 05/08/2019 0414   VLDL 17 05/08/2019 0414   LDLCALC 120 (H) 05/08/2019 0414   HgbA1c:  Lab Results  Component Value Date   HGBA1C 6.8 (H) 05/08/2019   Urine Drug Screen: No results found for: LABOPIA, COCAINSCRNUR, LABBENZ, AMPHETMU, THCU, LABBARB  Alcohol Level No results found for: [facility]  IMAGING [name] Contrast  Result Date: 05/08/2019 CLINICAL DATA:  68 y/o M; headache, blurred vision, right arm weakness. TIA, initial exam. History of tongue cancer post resection and radiation. EXAM: MRI HEAD WITHOUT CONTRAST TECHNIQUE: Multiplanar, multiecho pulse sequences of the brain and surrounding structures were obtained without intravenous contrast. COMPARISON:  05/07/2019 CT head. FINDINGS: Brain: Mildly increased diffusion signal and mildly decreased ADC of the left caudate  nucleus head and body on the axial diffusion sequence (series 8, image 30) and to lesser degree on the coronal diffusion sequence (series 1210, image 22). No associated hemorrhage or mass effect. On the axial DWI sequence there is also increased signal within the left anterior cingulate gyrus, right insula, and right hippocampus, however, this is not replicated on the coronal sequences and may be artifactual. No additional corresponding signal abnormality on other sequences. No acute hemorrhage, hydrocephalus, extra-axial collection, or mass effect. Scattered punctate nonspecific T2 FLAIR hyperintensities in subcortical and periventricular white matter are compatible with mild chronic microvascular ischemic changes. Mild volume loss of the brain. Vascular: Normal flow voids. Skull and upper cervical spine: Normal marrow signal. Sinuses/Orbits: Negative. Other: None. IMPRESSION: Mildly decreased diffusion of the left caudate nucleus. Findings may represent acute/early subacute infarction, however, the distribution is somewhat atypical for lacunar infarction raising the question of seizure related activity or metabolic/toxic injury. Electronically Signed   By: Kristine Garbe M.D.   On: 05/08/2019 00:24   Ct Head Code Stroke Wo Contrast  Result Date: 05/07/2019 CLINICAL DATA:  Code stroke. Headache above the left eye, blurred vision, and right hand weakness lasting 15-30 minutes. EXAM: CT HEAD WITHOUT CONTRAST TECHNIQUE: Contiguous axial images were obtained from the base of  the skull through the vertex without intravenous contrast. COMPARISON:  None. FINDINGS: Brain: There is no evidence of acute infarct, intracranial hemorrhage, mass, midline shift, or extra-axial fluid collection. The ventricles and sulci are within normal limits for age. Patchy hypodensities in the cerebral white matter bilaterally are nonspecific but compatible with mild chronic small vessel ischemic disease. Vascular: No hyperdense  vessel. Skull: No fracture or suspicious osseous lesion. Sinuses/Orbits: Visualized paranasal sinuses and mastoid air cells are clear. Visualized orbits are unremarkable. Other: None. ASPECTS [facility] Stroke Program Early CT Score) - Ganglionic level infarction (caudate, lentiform nuclei, internal capsule, insula, M1-M3 cortex): 7 - Supraganglionic infarction (M4-M6 cortex): 3 Total score (0-10 with 10 being normal): 10 IMPRESSION: 1. No evidence of acute intracranial abnormality. 2. ASPECTS is 10. 3. Mild chronic small vessel ischemic disease. These results were called by telephone at the time of interpretation on 05/07/2019 at 1:00 pm to Dr. Aletta Edouard , who verbally acknowledged these results. Electronically Signed   By: Logan Bores M.D.   On: 05/07/2019 13:01    PHYSICAL EXAM  Temp:  [97.4 F (36.3 C)-98.6 F (37 C)] 98.1 F (36.7 C) (07/16 1700) Pulse Rate:  [41-54] 48 (07/16 1700) Resp:  [11-16] 13 (07/16 1700) BP: (103-164)/(51-75) 131/71 (07/16 1700) SpO2:  [94 %-99 %] 99 % (07/16 1700) Weight:  [105 kg] 105 kg (07/15 1946)  General - Well nourished, well developed, in no apparent distress.  Ophthalmologic - fundi not visualized due to noncooperation.  Cardiovascular - Regular rate and rhythm.  Mental Status -  Level of arousal and orientation to time, place, and person were intact. Language including expression, naming, repetition, comprehension was assessed and found intact. Attention span and concentration were normal. Recent and remote memory were intact. Fund of Knowledge was assessed and was intact.  Cranial Nerves II - XII - II - Visual field intact OU. III, IV, VI - Extraocular movements intact. V - Facial sensation intact bilaterally. VII - Facial movement intact bilaterally. VIII - Hearing & vestibular intact bilaterally. X - Palate elevates symmetrically. XI - Chin turning & shoulder shrug intact bilaterally. XII - Tongue protrusion intact.  Motor Strength  - The patient's strength was normal in all extremities and pronator drift was absent.  Bulk was normal and fasciculations were absent.   Motor Tone - Muscle tone was assessed at the neck and appendages and was normal.  Reflexes - The patient's reflexes were symmetrical in all extremities and he had no pathological reflexes.  Sensory - Light touch, temperature/pinprick were assessed and were symmetrical.    Coordination - The patient had normal movements in the hands and feet with no ataxia or dysmetria.  Tremor was absent.  Gait and Station - deferred.   ASSESSMENT/PLAN Mr. Henry Bridges is a 68 y.o. male with history of DB, HTN, GERD and tongue cancer presenting to [facility] with R hand weakness and incoordination associated w/ a frontal HA first.   TIA:  left brain TIA secondary to left carotid stenosis  Code Stroke CT head No acute abnormality. Small vessel disease. ASPECTS 10.     MRI without contrast L caudate nucleus decreased diffusion, ? Infarct vs sz  MRI w/w/o no acute abnormality  Carotid Doppler left ICA 60 to 79% stenosis  CTA head and neck pending  2D Echo EF 55 to 60%  EEG normal, no seizure  LDL 120  HgbA1c 6.8  UDS pending   Lovenox 40 mg sq daily for VTE prophylaxis  No antithrombotic prior to admission, now on aspirin 81 and Plavix 75.  Continue DAPT for 3 weeks and then aspirin alone.  Therapy recommendations:  No therapy needs  Disposition:  Return home  Carotid stenosis  Carotid Doppler showed left ICA 60 to 79% stenosis  CTA head and neck pending  If left ICA high-grade stenosis confirmed, patient need vascular surgery consultation for possible CEA  Hypertension  Stable . Permissive hypertension (OK if < 220/120) but gradually normalize in 3-5 days . Long-term BP goal 130-150 before procedure given left ICA stenosis  Hyperlipidemia  Home meds:  No statin  Now on lipitor 40  LDL 120, goal < 70  Continue statin at  discharge  Diabetes type II Controlled  HgbA1c 6.8, goal < 7.0  CBGs  SSI  PCP follow-up  Other Stroke Risk Factors  Advanced age  Hx ETOH use  Obesity, Body mass index is 33.21 kg/m., recommend weight loss, diet and exercise as appropriate   Other Active Problems  Hypothyroidism  Hx tongue cancer  Hospital day # 0  Rosalin Hawking, MD PhD Stroke Neurology 05/08/2019 6:31 PM    To contact Stroke Continuity provider, please refer to [email or web address] After hours, contact General Neurology

## 2019-05-08 NOTE — Plan of Care (Signed)
  Problem: Education: Goal: Knowledge of disease or condition will improve Outcome: Progressing Goal: Knowledge of secondary prevention will improve Outcome: Progressing Goal: Knowledge of patient specific risk factors addressed and post discharge goals established will improve Outcome: Progressing Goal: Individualized Educational Video(s) Outcome: Progressing   Problem: Ischemic Stroke/TIA Tissue Perfusion: Goal: Complications of ischemic stroke/TIA will be minimized Outcome: Progressing   

## 2019-05-08 NOTE — Plan of Care (Signed)
Progressing towards goals

## 2019-05-08 NOTE — Consult Note (Signed)
[facility] Tele[facility]urology Consult Services   Date of Service:   05/07/2019 12:55:20  Impression:     .  Transient Ischemic Attack     .  Complex headache  Comments/Sign-Out: Patient around 11 started having a headache, with blurry vision and right arm was weak. Also nausea. All went away within 15-20 minutes. No previous hx of this. He does have history of headaches, today morning he had the headache similar to his normal headaches but more intense. - TIA work up - If continues to happen with headache then need prophy meds. - ASA for now  Metrics: Last Known Well: 05/07/2019 09:00:00 [facility] Notification Time: 05/07/2019 12:55:20 Arrival Time: 05/07/2019 11:45:00 Stamp Time: 05/07/2019 12:55:20 Time First Login Attempt: 05/07/2019 13:02:39 Video Start Time: 05/07/2019 13:02:39  Symptoms: headache NIHSS Start Assessment Time: 05/07/2019 13:04:00 Patient is not a candidate for tPA. Patient was not deemed candidate for tPA thrombolytics because of Resolved symptoms (no residual disabling symptoms). Video End Time: 05/07/2019 13:21:20  CT head showed no acute hemorrhage or acute core infarct. CT head was reviewed.  Clinical Presentation is not Suggestive of Large Vessel Occlusive Disease  ED Physician notified of diagnostic impression and management plan on 05/07/2019 13:21:21  Our recommendations are outlined below.  Recommendations:     .  Activate Stroke Protocol Admission/Order Set     .  Stroke/Telemetry Floor     .  Neuro Checks     .  Bedside Swallow Eval     .  DVT Prophylaxis     .  IV Fluids, Normal Saline     .  Head of Bed 30 Degrees     .  Euglycemia and Avoid Hyperthermia (PRN Acetaminophen)     .  Antiplatelet Therapy Recommended  Routine Consultation with [facility] for Follow up Care  Sign Out:     .  Discussed with Emergency Department  Provider    ------------------------------------------------------------------------------  History of Present Illness: Patient is a 68 year old Male.  Patient was brought by private transportation with symptoms of headache  Patient around 11 started having a headache, with blurry vision and right arm was weak. Also nausea. All went away within 15-20 minutes. No previous hx of this. He does have history of headaches, today morning he had the headache similar to his normal headaches but more intense.    Examination: 1A: Level of Consciousness - Alert; keenly responsive + 0 1B: Ask Month and Age - Both Questions Right + 0 1C: Blink Eyes & Squeeze Hands - Performs Both Tasks + 0 2: Test Horizontal Extraocular Movements - Normal + 0 3: Test Visual Fields - No Visual Loss + 0 4: Test Facial Palsy (Use Grimace if Obtunded) - Normal symmetry + 0 5A: Test Left Arm Motor Drift - No Drift for 10 Seconds + 0 5B: Test Right Arm Motor Drift - No Drift for 10 Seconds + 0 6A: Test Left Leg Motor Drift - No Drift for 5 Seconds + 0 6B: Test Right Leg Motor Drift - No Drift for 5 Seconds + 0 7: Test Limb Ataxia (FNF/Heel-Shin) - No Ataxia + 0 8: Test Sensation - Normal; No sensory loss + 0 9: Test Language/Aphasia - Normal; No aphasia + 0 10: Test Dysarthria - Normal + 0 11: Test Extinction/Inattention - No abnormality + 0  NIHSS Score: 0   Due to the immediate potential for life-threatening deterioration due to underlying acute neurologic illness, I spent 35 minutes providing critical care.  This time includes time for face to face visit via telemedicine, review of medical records, imaging studies and discussion of findings with providers, the patient and/or family.   Dr Lemar Livings   [facility] [phone]  Case [id number]

## 2019-05-09 ENCOUNTER — Other Ambulatory Visit: Payer: Self-pay | Admitting: Student

## 2019-05-09 DIAGNOSIS — G459 Transient cerebral ischemic attack, unspecified: Secondary | ICD-10-CM | POA: Diagnosis not present

## 2019-05-09 DIAGNOSIS — E119 Type 2 diabetes mellitus without complications: Secondary | ICD-10-CM | POA: Diagnosis not present

## 2019-05-09 LAB — GLUCOSE, CAPILLARY
Glucose-Capillary: 118 mg/dL — ABNORMAL HIGH (ref 70–99)
Glucose-Capillary: 146 mg/dL — ABNORMAL HIGH (ref 70–99)

## 2019-05-09 LAB — SARS CORONAVIRUS 2 BY RT PCR (HOSPITAL ORDER, PERFORMED IN ~~LOC~~ HOSPITAL LAB): SARS Coronavirus 2: NEGATIVE

## 2019-05-09 MED ORDER — ASPIRIN EC 81 MG PO TBEC: 81.0000 mg | DELAYED_RELEASE_TABLET | Freq: Every day | ORAL | 3 refills | Status: DC

## 2019-05-09 MED ORDER — ASPIRIN EC 325 MG PO TBEC: 325.0000 mg | DELAYED_RELEASE_TABLET | Freq: Every day | ORAL | Status: DC

## 2019-05-09 MED ORDER — ATORVASTATIN CALCIUM 20 MG PO TABS: 40.0000 mg | ORAL_TABLET | Freq: Every day | ORAL | 2 refills | Status: DC

## 2019-05-09 MED ORDER — CLOPIDOGREL BISULFATE 75 MG PO TABS: 75.0000 mg | ORAL_TABLET | Freq: Every day | ORAL | 2 refills | Status: DC

## 2019-05-09 MED ORDER — ASPIRIN 325 MG PO TBEC: 325.0000 mg | DELAYED_RELEASE_TABLET | Freq: Every day | ORAL | 0 refills | Status: DC

## 2019-05-09 NOTE — Progress Notes (Addendum)
Patient scheduled for angiogram with possible intervention Monday.  Discussed with patient via telephone who states he is able to come Monday for procedure.  Per Dr. Estanislado Pandy, patient should continue aspirin 325 mg, Plavix 75 mg daily. Patient is aware.  Rule out COVID test today for anesthesia per protocol.  Brynda Greathouse, MS RD PA-C

## 2019-05-09 NOTE — Progress Notes (Signed)
STROKE TEAM PROGRESS NOTE   INTERVAL HISTORY Pt sitting in chair. Symptoms all resolved.  Patient recounted HPI with me.  He had episode of right hand clumsiness, not able to hike for 10 minutes.  At same time he had a headache lasting 1-1/2 hours.  He denies any history of migraine or seizure.  Denies weakness of the leg or face, numbness or tingling, dizziness, or loss of consciousness.  Vitals:   05/09/19 0300 05/09/19 0300 05/09/19 0746 05/09/19 1225  BP: 110/71 110/71 115/65 129/68  Pulse: (!) 40 (!) 40 (!) 53 (!) 46  Resp:  14 12 14   Temp: 98.6 F (37 C) 98.6 F (37 C) 98.6 F (37 C) 97.7 F (36.5 C)  TempSrc: Oral Oral Oral Oral  SpO2: 99% 99% 97% 99%  Weight:      Height:        CBC:  Recent Labs  Lab 05/07/19 1237 05/08/19 0414  WBC 6.1 5.5  NEUTROABS 4.8  --   HGB 13.8 13.1  HCT 41.7 39.2  MCV 88.7 87.7  PLT 193 762    Basic Metabolic Panel:  Recent Labs  Lab 05/07/19 1237  NA 138  K 3.7  CL 103  CO2 23  GLUCOSE 171*  BUN 18  CREATININE 1.08  CALCIUM 9.4   Lipid Panel:     Component Value Date/Time   CHOL 196 05/08/2019 0414   TRIG 87 05/08/2019 0414   HDL 59 05/08/2019 0414   CHOLHDL 3.3 05/08/2019 0414   VLDL 17 05/08/2019 0414   LDLCALC 120 (H) 05/08/2019 0414   HgbA1c:  Lab Results  Component Value Date   HGBA1C 6.8 (H) 05/08/2019   Urine Drug Screen: No results found for: LABOPIA, COCAINSCRNUR, LABBENZ, AMPHETMU, THCU, LABBARB  Alcohol Level No results found for: ETH  IMAGING Ct Angio Head W Or Wo Contrast  Result Date: 05/08/2019 CLINICAL DATA:  TIA EXAM: CT ANGIOGRAPHY HEAD AND NECK TECHNIQUE: Multidetector CT imaging of the head and neck was performed using the standard protocol during bolus administration of intravenous contrast. Multiplanar CT image reconstructions and MIPs were obtained to evaluate the vascular anatomy. Carotid stenosis measurements (when applicable) are obtained utilizing NASCET criteria, using the distal  internal carotid diameter as the denominator. CONTRAST:  145mL OMNIPAQUE IOHEXOL 350 MG/ML SOLN COMPARISON:  MRI head 05/08/2019, CT head 05/07/2019 FINDINGS: CTA NECK FINDINGS Aortic arch: 4 vessel arch. Left vertebral artery origin from the arch. No significant atherosclerotic disease dissection or aneurysm in the aortic arch. Proximal great vessels widely patent. Right carotid system: Right carotid widely patent. Minimal atherosclerotic disease right bifurcation Left carotid system: Left common carotid artery widely patent. Atherosclerotic disease in the left carotid bulb and proximal left internal carotid artery. 50% diameter stenosis of the left internal carotid artery above the bulb. Vertebral arteries: Left vertebral non dominant with origin from the arch. Both vertebral arteries are patent the basilar without significant stenosis. Skeleton: No acute skeletal abnormality. Other neck: Negative for mass or adenopathy Upper chest: Lung apices clear bilaterally. Review of the MIP images confirms the above findings CTA HEAD FINDINGS Anterior circulation: Cavernous carotid widely patent bilaterally without stenosis. Anterior and middle cerebral arteries widely patent bilaterally. Posterior circulation: Both vertebral arteries patent to the basilar. PICA patent bilaterally. Basilar widely patent. Superior cerebellar and posterior cerebral arteries and anterior inferior cerebellar arteries patent bilaterally. Fetal origin right posterior cerebral artery. Venous sinuses: Patent Negative for aneurysm. Review of the MIP images confirms the above findings IMPRESSION:  1. 50% diameter stenosis proximal left internal carotid artery 2. Right carotid artery widely patent. Both vertebral arteries widely patent. 3. Negative for intracranial large vessel occlusion or significant stenosis. Electronically Signed   By: Franchot Gallo M.D.   On: 05/08/2019 19:17   Ct Angio Neck W Or Wo Contrast  Result Date: 05/08/2019 CLINICAL  DATA:  TIA EXAM: CT ANGIOGRAPHY HEAD AND NECK TECHNIQUE: Multidetector CT imaging of the head and neck was performed using the standard protocol during bolus administration of intravenous contrast. Multiplanar CT image reconstructions and MIPs were obtained to evaluate the vascular anatomy. Carotid stenosis measurements (when applicable) are obtained utilizing NASCET criteria, using the distal internal carotid diameter as the denominator. CONTRAST:  141mL OMNIPAQUE IOHEXOL 350 MG/ML SOLN COMPARISON:  MRI head 05/08/2019, CT head 05/07/2019 FINDINGS: CTA NECK FINDINGS Aortic arch: 4 vessel arch. Left vertebral artery origin from the arch. No significant atherosclerotic disease dissection or aneurysm in the aortic arch. Proximal great vessels widely patent. Right carotid system: Right carotid widely patent. Minimal atherosclerotic disease right bifurcation Left carotid system: Left common carotid artery widely patent. Atherosclerotic disease in the left carotid bulb and proximal left internal carotid artery. 50% diameter stenosis of the left internal carotid artery above the bulb. Vertebral arteries: Left vertebral non dominant with origin from the arch. Both vertebral arteries are patent the basilar without significant stenosis. Skeleton: No acute skeletal abnormality. Other neck: Negative for mass or adenopathy Upper chest: Lung apices clear bilaterally. Review of the MIP images confirms the above findings CTA HEAD FINDINGS Anterior circulation: Cavernous carotid widely patent bilaterally without stenosis. Anterior and middle cerebral arteries widely patent bilaterally. Posterior circulation: Both vertebral arteries patent to the basilar. PICA patent bilaterally. Basilar widely patent. Superior cerebellar and posterior cerebral arteries and anterior inferior cerebellar arteries patent bilaterally. Fetal origin right posterior cerebral artery. Venous sinuses: Patent Negative for aneurysm. Review of the MIP images  confirms the above findings IMPRESSION: 1. 50% diameter stenosis proximal left internal carotid artery 2. Right carotid artery widely patent. Both vertebral arteries widely patent. 3. Negative for intracranial large vessel occlusion or significant stenosis. Electronically Signed   By: Franchot Gallo M.D.   On: 05/08/2019 19:17   [name] Contrast  Result Date: 05/08/2019 CLINICAL DATA:  68 y/o M; headache, blurred vision, right arm weakness. TIA, initial exam. History of tongue cancer post resection and radiation. EXAM: MRI HEAD WITHOUT CONTRAST TECHNIQUE: Multiplanar, multiecho pulse sequences of the brain and surrounding structures were obtained without intravenous contrast. COMPARISON:  05/07/2019 CT head. FINDINGS: Brain: Mildly increased diffusion signal and mildly decreased ADC of the left caudate nucleus head and body on the axial diffusion sequence (series 8, image 30) and to lesser degree on the coronal diffusion sequence (series 1210, image 22). No associated hemorrhage or mass effect. On the axial DWI sequence there is also increased signal within the left anterior cingulate gyrus, right insula, and right hippocampus, however, this is not replicated on the coronal sequences and may be artifactual. No additional corresponding signal abnormality on other sequences. No acute hemorrhage, hydrocephalus, extra-axial collection, or mass effect. Scattered punctate nonspecific T2 FLAIR hyperintensities in subcortical and periventricular white matter are compatible with mild chronic microvascular ischemic changes. Mild volume loss of the brain. Vascular: Normal flow voids. Skull and upper cervical spine: Normal marrow signal. Sinuses/Orbits: Negative. Other: None. IMPRESSION: Mildly decreased diffusion of the left caudate nucleus. Findings may represent acute/early subacute infarction, however, the distribution is somewhat atypical for lacunar infarction  raising the question of seizure related activity or  metabolic/toxic injury. Electronically Signed   By: Kristine Garbe M.D.   On: 05/08/2019 00:24   Mr Jeri Cos HY Contrast  Result Date: 05/08/2019 CLINICAL DATA:  TIA.  Hand weakness.  History of tongue cancer EXAM: MRI HEAD WITHOUT AND WITH CONTRAST TECHNIQUE: Multiplanar, multiecho pulse sequences of the brain and surrounding structures were obtained without and with intravenous contrast. CONTRAST:  10 mL Gadovist IV COMPARISON:  MRI head 05/07/2019 FINDINGS: Brain: Negative for acute infarct. Previously noted signal in the left caudate on axial diffusion imaging no longer present and likely was artifact. Different machines utilized on the 2 studies. Ventricle size normal. Scattered small white matter hyperintensities bilaterally appear chronic. Negative for hemorrhage mass or fluid collection. No midline shift. Normal enhancement. No evidence of metastatic disease. No abnormal enhancement in the left caudate region. Vascular: Normal arterial flow voids Skull and upper cervical spine: Negative Sinuses/Orbits: Negative Other: None IMPRESSION: No acute abnormality Diffusion-weighted signal left caudate on yesterday's study appears to be artifactual. Mild chronic white matter changes likely due to microvascular ischemia. Electronically Signed   By: Franchot Gallo M.D.   On: 05/08/2019 13:16   Vas US Carotid (at [place] Only)  Result Date: 05/08/2019 Carotid Arterial Duplex Study Indications:       TIA. Risk Factors:      Hypertension. Comparison Study:  no prior Performing Technologist: Abram Sander RVS  Examination Guidelines: A complete evaluation includes B-mode imaging, spectral Doppler, color Doppler, and power Doppler as needed of all accessible portions of each vessel. Bilateral testing is considered an integral part of a complete examination. Limited examinations for reoccurring indications may be performed as noted.  Right Carotid Findings:  +----------+--------+--------+--------+------------+--------+           PSV cm/sEDV cm/sStenosisDescribe    Comments +----------+--------+--------+--------+------------+--------+ CCA Prox  85      16              heterogenous         +----------+--------+--------+--------+------------+--------+ CCA Distal75      23              heterogenous         +----------+--------+--------+--------+------------+--------+ ICA Prox  81      27      1-39%   heterogenous         +----------+--------+--------+--------+------------+--------+ ICA Distal63      22                                   +----------+--------+--------+--------+------------+--------+ ECA       72      7                                    +----------+--------+--------+--------+------------+--------+ +----------+--------+-------+--------+-------------------+           PSV cm/sEDV cmsDescribeArm Pressure (mmHG) +----------+--------+-------+--------+-------------------+ Subclavian100                                        +----------+--------+-------+--------+-------------------+ +---------+--------+--+--------+--+---------+ VertebralPSV cm/s37EDV cm/s10Antegrade +---------+--------+--+--------+--+---------+  Left Carotid Findings: +----------+--------+--------+--------+------------+--------+           PSV cm/sEDV cm/sStenosisDescribe    Comments +----------+--------+--------+--------+------------+--------+ CCA Prox  73      16                                   +----------+--------+--------+--------+------------+--------+  CCA Distal67      16              heterogenous         +----------+--------+--------+--------+------------+--------+ ICA Prox  237     83      60-79%  heterogenous         +----------+--------+--------+--------+------------+--------+ ICA Mid   166     61                                   +----------+--------+--------+--------+------------+--------+ ICA  Distal107     23                                   +----------+--------+--------+--------+------------+--------+ ECA       96      14                                   +----------+--------+--------+--------+------------+--------+ +----------+--------+--------+--------+-------------------+ SubclavianPSV cm/sEDV cm/sDescribeArm Pressure (mmHG) +----------+--------+--------+--------+-------------------+           104                                         +----------+--------+--------+--------+-------------------+ +---------+--------+--+--------+-+---------+ VertebralPSV cm/s38EDV cm/s6Antegrade +---------+--------+--+--------+-+---------+  Summary: Right Carotid: Velocities in the right ICA are consistent with a 1-39% stenosis. Left Carotid: Velocities in the left ICA are consistent with a 60-79% stenosis. Vertebrals: Bilateral vertebral arteries demonstrate antegrade flow. *See table(s) above for measurements and observations.     Preliminary     PHYSICAL EXAM  Temp:  [97.6 F (36.4 C)-98.6 F (37 C)] 97.7 F (36.5 C) (07/17 1225) Pulse Rate:  [40-53] 46 (07/17 1225) Resp:  [12-15] 14 (07/17 1225) BP: (97-132)/(50-95) 129/68 (07/17 1225) SpO2:  [97 %-99 %] 99 % (07/17 1225)  General - Well nourished, well developed, in no apparent distress.  Ophthalmologic - fundi not visualized due to noncooperation.  Cardiovascular - Regular rate and rhythm.  Mental Status -  Level of arousal and orientation to time, place, and person were intact. Language including expression, naming, repetition, comprehension was assessed and found intact. Attention span and concentration were normal. Recent and remote memory were intact. Fund of Knowledge was assessed and was intact.  Cranial Nerves II - XII - II - Visual field intact OU. III, IV, VI - Extraocular movements intact. V - Facial sensation intact bilaterally. VII - Facial movement intact bilaterally. VIII - Hearing &  vestibular intact bilaterally. X - Palate elevates symmetrically. XI - Chin turning & shoulder shrug intact bilaterally. XII - Tongue protrusion intact.  Motor Strength - The patient's strength was normal in all extremities and pronator drift was absent.  Bulk was normal and fasciculations were absent.   Motor Tone - Muscle tone was assessed at the neck and appendages and was normal.  Reflexes - The patient's reflexes were symmetrical in all extremities and he had no pathological reflexes.  Sensory - Light touch, temperature/pinprick were assessed and were symmetrical.    Coordination - The patient had normal movements in the hands and feet with no ataxia or dysmetria.  Tremor was absent.  Gait and Station - deferred.   ASSESSMENT/PLAN Mr. LATERRANCE NAUTA is a 68 y.o. male  with history of DB, HTN, GERD and tongue cancer presenting to [facility] with R hand weakness and incoordination associated w/ a frontal HA first.   TIA:  left brain TIA secondary to left carotid stenosis  Code Stroke CT head No acute abnormality. Small vessel disease. ASPECTS 10.     MRI without contrast L caudate nucleus decreased diffusion, ? Infarct vs sz  MRI w/w/o no acute abnormality  Carotid Doppler left ICA 60 to 79% stenosis  CTA head and neck left ICA 60% stenosis on my calculation  2D Echo EF 55 to 60%  EEG normal, no seizure  LDL 120  HgbA1c 6.8  Lovenox 40 mg sq daily for VTE prophylaxis  No antithrombotic prior to admission, now on aspirin 81 and Plavix 75.  Continue DAPT on discharge. Future antiplatelet regimen per Dr. Estanislado Pandy  Therapy recommendations:  No therapy needs  Disposition:  Return home  Carotid stenosis  Carotid Doppler left ICA 60 to 79% stenosis  CTA head and neck left ICA 60% on my calculation (although radiology report said 50%)  Discussed with Dr. Estanislado Pandy, pt will have carotid angiogram and potential left CAS ASAP as outpt. Likely next  Monday.  Hypertension  Stable . Permissive hypertension (OK if < 220/120) but gradually normalize in 3-5 days . Long-term BP goal 130-150 before procedure given left ICA stenosis  Hyperlipidemia  Home meds:  No statin  Now on lipitor 40  LDL 120, goal < 70  Continue statin at discharge  Diabetes type II Controlled  HgbA1c 6.8, goal < 7.0  CBGs  SSI  PCP follow-up  Other Stroke Risk Factors  Advanced age  Hx ETOH use  Obesity, Body mass index is 33.21 kg/m., recommend weight loss, diet and exercise as appropriate   Other Active Problems  Hypothyroidism  Hx tongue cancer  Hospital day # 0  Neurology will sign off. Please call with questions. Pt will follow up with stroke clinic NP at [facility] in about 4 weeks. Thanks for the consult.   Rosalin Hawking, MD PhD Stroke Neurology 05/09/2019 2:51 PM    To contact Stroke Continuity provider, please refer to [email or web address] After hours, contact General Neurology

## 2019-05-09 NOTE — Plan of Care (Signed)
  Problem: Education: Goal: Knowledge of General Education information will improve Description Including pain rating scale, medication(s)/side effects and non-pharmacologic comfort measures Outcome: Progressing   

## 2019-05-09 NOTE — Discharge Summary (Signed)
Physician Discharge Summary  Henry Bridges BSW:[id number] DOB: 14-Apr-1951 DOA: 05/07/2019  PCP: Hulan Fess, MD  Admit date: 05/07/2019 Discharge date: 05/09/2019  Admitted From: home  Disposition:  Home   Recommendations for Outpatient Follow-up:  1. Follow up with PCP in 1-2 weeks 2. You are scheduled for carotid angiogram and possible stenting for Monday, 05/12/2019.  Home Health: Not applicable Equipment/Devices: Not applicable  Discharge Condition: Stable CODE STATUS: Full code Diet recommendation: Low-carb diet  Brief/Interim Summary: 68 year old gentleman with history of tongue cancer status post remote resection and radiation, hypertension, type 2 diabetes, GERD and hypothyroidism who presented to the emergency room with sudden onset of global headache, blurred vision and right arm weakness.  He started with mild left frontal headache followed by blurred vision and right arm weakness.  Most of the symptoms improved within 30 minutes.  In the emergency room he was afebrile.  On room air.  Sinus bradycardic. stable blood pressures.  Noncontrast head CT was negative for any acute findings.  Chemistry panel was normal.  COVID-19 was negative.  Admitted and underwent evaluation for TIA.  Discharge Diagnoses:  Principal Problem:   TIA (transient ischemic attack) Active Problems:   Hypertension   Diabetes mellitus without complication (HCC)   Hypothyroidism  TIA with amaurosis fugax:  All of his symptoms improved.  No neurological deficit. Patient was not a TPA and vascular intervention candidate because of improved symptoms on arrival. Antiplatelet, aspirin 325 mg daily and Plavix 75 mg daily. Statin, Lipitor 40 mg daily, LDL 120, A1c 6.8 Blood pressures on goals. Seen by speech, PT OT, no remaining deficits. MRI brain was abnormal, MRI brain with contrast shows no acute findings. 2D echocardiogram was normal.  EEG was normal. Carotid duplexes showed 60-79% left ICA  stenosis.  A CTA showed 60% left ICA stenosis. Interventional neuroradiology consulted, patient is scheduled for carotid angiogram and possible stenting on 05/12/2019. With patient's clinical stability, patient is going home and coming back Monday for procedure.  Preprocedure COVID-19 swab to be done today, he will not take metformin until procedure and 48 hours after. He will continue high-dose aspirin and Plavix.  Patient had a history of intolerance to aspirin mostly causing reflux.  He may have to increase dose of lansoprazole to twice a day if experiences any symptoms. Patient is on combination amlodipine and atorvastatin 10/10 that is not enough statin.  Will prescribe additional 20 mg atorvastatin daily that will make 30 total.     Discharge Instructions  Discharge Instructions    Ambulatory referral to Neurology   Complete by: As directed    Follow up with stroke clinic NP (Jessica Vanschaick or Cecille Rubin, if both not available, consider Zachery Dauer, or Ahern) at [facility] in about 4 weeks. Thanks.   Diet Carb Modified   Complete by: As directed    Discharge instructions   Complete by: As directed    Do not take metfromin before procedure and 48 hours after   Increase activity slowly   Complete by: As directed      Allergies as of 05/09/2019      Reactions   Aspirin Other (See Comments)   Made his heartburn worse      Medication List    TAKE these medications   amlodipine-atorvastatin 10-10 MG tablet Commonly known as: CADUET Take 1 tablet by mouth daily.   aspirin 325 MG EC tablet Take 1 tablet (325 mg total) by mouth daily. Start taking on: May 10, 2019  atorvastatin 20 MG tablet Commonly known as: LIPITOR Take 2 tablets (40 mg total) by mouth daily at 6 PM.   clopidogrel 75 MG tablet Commonly known as: PLAVIX Take 1 tablet (75 mg total) by mouth daily. Start taking on: May 10, 2019   doxazosin 2 MG tablet Commonly known as: CARDURA Take 2 mg by  mouth daily.   hydrochlorothiazide 25 MG tablet Commonly known as: HYDRODIURIL Take 25 mg by mouth daily.   lansoprazole 30 MG capsule Commonly known as: PREVACID Take 30 mg by mouth daily at 12 noon.   levothyroxine 125 MCG tablet Commonly known as: SYNTHROID Take 125 mcg by mouth daily before breakfast.   losartan 100 MG tablet Commonly known as: COZAAR Take 100 mg by mouth daily.   metFORMIN 1000 MG tablet Commonly known as: GLUCOPHAGE Take 1,000 mg by mouth 2 (two) times daily with a meal.      Follow-up Information    [facility]. Schedule an appointment as soon as possible for a visit in 4 week(s).   Specialty: Neurology Contact information: [place] [phone]         Allergies  Allergen Reactions  . Aspirin Other (See Comments)    Made his heartburn worse    Consultations:  Neurology  Vascular radiology   Procedures/Studies: Ct Angio Head W Or Wo Contrast  Result Date: 05/08/2019 CLINICAL DATA:  TIA EXAM: CT ANGIOGRAPHY HEAD AND NECK TECHNIQUE: Multidetector CT imaging of the head and neck was performed using the standard protocol during bolus administration of intravenous contrast. Multiplanar CT image reconstructions and MIPs were obtained to evaluate the vascular anatomy. Carotid stenosis measurements (when applicable) are obtained utilizing NASCET criteria, using the distal internal carotid diameter as the denominator. CONTRAST:  149mL OMNIPAQUE IOHEXOL 350 MG/ML SOLN COMPARISON:  MRI head 05/08/2019, CT head 05/07/2019 FINDINGS: CTA NECK FINDINGS Aortic arch: 4 vessel arch. Left vertebral artery origin from the arch. No significant atherosclerotic disease dissection or aneurysm in the aortic arch. Proximal great vessels widely patent. Right carotid system: Right carotid widely patent. Minimal atherosclerotic disease right bifurcation Left carotid system: Left common carotid artery  widely patent. Atherosclerotic disease in the left carotid bulb and proximal left internal carotid artery. 50% diameter stenosis of the left internal carotid artery above the bulb. Vertebral arteries: Left vertebral non dominant with origin from the arch. Both vertebral arteries are patent the basilar without significant stenosis. Skeleton: No acute skeletal abnormality. Other neck: Negative for mass or adenopathy Upper chest: Lung apices clear bilaterally. Review of the MIP images confirms the above findings CTA HEAD FINDINGS Anterior circulation: Cavernous carotid widely patent bilaterally without stenosis. Anterior and middle cerebral arteries widely patent bilaterally. Posterior circulation: Both vertebral arteries patent to the basilar. PICA patent bilaterally. Basilar widely patent. Superior cerebellar and posterior cerebral arteries and anterior inferior cerebellar arteries patent bilaterally. Fetal origin right posterior cerebral artery. Venous sinuses: Patent Negative for aneurysm. Review of the MIP images confirms the above findings IMPRESSION: 1. 50% diameter stenosis proximal left internal carotid artery 2. Right carotid artery widely patent. Both vertebral arteries widely patent. 3. Negative for intracranial large vessel occlusion or significant stenosis. Electronically Signed   By: Franchot Gallo M.D.   On: 05/08/2019 19:17   Ct Angio Neck W Or Wo Contrast  Result Date: 05/08/2019 CLINICAL DATA:  TIA EXAM: CT ANGIOGRAPHY HEAD AND NECK TECHNIQUE: Multidetector CT imaging of the head and neck was performed using the standard  protocol during bolus administration of intravenous contrast. Multiplanar CT image reconstructions and MIPs were obtained to evaluate the vascular anatomy. Carotid stenosis measurements (when applicable) are obtained utilizing NASCET criteria, using the distal internal carotid diameter as the denominator. CONTRAST:  156mL OMNIPAQUE IOHEXOL 350 MG/ML SOLN COMPARISON:  MRI head  05/08/2019, CT head 05/07/2019 FINDINGS: CTA NECK FINDINGS Aortic arch: 4 vessel arch. Left vertebral artery origin from the arch. No significant atherosclerotic disease dissection or aneurysm in the aortic arch. Proximal great vessels widely patent. Right carotid system: Right carotid widely patent. Minimal atherosclerotic disease right bifurcation Left carotid system: Left common carotid artery widely patent. Atherosclerotic disease in the left carotid bulb and proximal left internal carotid artery. 50% diameter stenosis of the left internal carotid artery above the bulb. Vertebral arteries: Left vertebral non dominant with origin from the arch. Both vertebral arteries are patent the basilar without significant stenosis. Skeleton: No acute skeletal abnormality. Other neck: Negative for mass or adenopathy Upper chest: Lung apices clear bilaterally. Review of the MIP images confirms the above findings CTA HEAD FINDINGS Anterior circulation: Cavernous carotid widely patent bilaterally without stenosis. Anterior and middle cerebral arteries widely patent bilaterally. Posterior circulation: Both vertebral arteries patent to the basilar. PICA patent bilaterally. Basilar widely patent. Superior cerebellar and posterior cerebral arteries and anterior inferior cerebellar arteries patent bilaterally. Fetal origin right posterior cerebral artery. Venous sinuses: Patent Negative for aneurysm. Review of the MIP images confirms the above findings IMPRESSION: 1. 50% diameter stenosis proximal left internal carotid artery 2. Right carotid artery widely patent. Both vertebral arteries widely patent. 3. Negative for intracranial large vessel occlusion or significant stenosis. Electronically Signed   By: Franchot Gallo M.D.   On: 05/08/2019 19:17   [name] Contrast  Result Date: 05/08/2019 CLINICAL DATA:  68 y/o M; headache, blurred vision, right arm weakness. TIA, initial exam. History of tongue cancer post resection and  radiation. EXAM: MRI HEAD WITHOUT CONTRAST TECHNIQUE: Multiplanar, multiecho pulse sequences of the brain and surrounding structures were obtained without intravenous contrast. COMPARISON:  05/07/2019 CT head. FINDINGS: Brain: Mildly increased diffusion signal and mildly decreased ADC of the left caudate nucleus head and body on the axial diffusion sequence (series 8, image 30) and to lesser degree on the coronal diffusion sequence (series 1210, image 22). No associated hemorrhage or mass effect. On the axial DWI sequence there is also increased signal within the left anterior cingulate gyrus, right insula, and right hippocampus, however, this is not replicated on the coronal sequences and may be artifactual. No additional corresponding signal abnormality on other sequences. No acute hemorrhage, hydrocephalus, extra-axial collection, or mass effect. Scattered punctate nonspecific T2 FLAIR hyperintensities in subcortical and periventricular white matter are compatible with mild chronic microvascular ischemic changes. Mild volume loss of the brain. Vascular: Normal flow voids. Skull and upper cervical spine: Normal marrow signal. Sinuses/Orbits: Negative. Other: None. IMPRESSION: Mildly decreased diffusion of the left caudate nucleus. Findings may represent acute/early subacute infarction, however, the distribution is somewhat atypical for lacunar infarction raising the question of seizure related activity or metabolic/toxic injury. Electronically Signed   By: Kristine Garbe M.D.   On: 05/08/2019 00:24   Mr Jeri Cos EE Contrast  Result Date: 05/08/2019 CLINICAL DATA:  TIA.  Hand weakness.  History of tongue cancer EXAM: MRI HEAD WITHOUT AND WITH CONTRAST TECHNIQUE: Multiplanar, multiecho pulse sequences of the brain and surrounding structures were obtained without and with intravenous contrast. CONTRAST:  10 mL Gadovist IV COMPARISON:  MRI  head 05/07/2019 FINDINGS: Brain: Negative for acute infarct.  Previously noted signal in the left caudate on axial diffusion imaging no longer present and likely was artifact. Different machines utilized on the 2 studies. Ventricle size normal. Scattered small white matter hyperintensities bilaterally appear chronic. Negative for hemorrhage mass or fluid collection. No midline shift. Normal enhancement. No evidence of metastatic disease. No abnormal enhancement in the left caudate region. Vascular: Normal arterial flow voids Skull and upper cervical spine: Negative Sinuses/Orbits: Negative Other: None IMPRESSION: No acute abnormality Diffusion-weighted signal left caudate on yesterday's study appears to be artifactual. Mild chronic white matter changes likely due to microvascular ischemia. Electronically Signed   By: Franchot Gallo M.D.   On: 05/08/2019 13:16   Ct Head Code Stroke Wo Contrast  Result Date: 05/07/2019 CLINICAL DATA:  Code stroke. Headache above the left eye, blurred vision, and right hand weakness lasting 15-30 minutes. EXAM: CT HEAD WITHOUT CONTRAST TECHNIQUE: Contiguous axial images were obtained from the base of the skull through the vertex without intravenous contrast. COMPARISON:  None. FINDINGS: Brain: There is no evidence of acute infarct, intracranial hemorrhage, mass, midline shift, or extra-axial fluid collection. The ventricles and sulci are within normal limits for age. Patchy hypodensities in the cerebral white matter bilaterally are nonspecific but compatible with mild chronic small vessel ischemic disease. Vascular: No hyperdense vessel. Skull: No fracture or suspicious osseous lesion. Sinuses/Orbits: Visualized paranasal sinuses and mastoid air cells are clear. Visualized orbits are unremarkable. Other: None. ASPECTS [facility] Stroke Program Early CT Score) - Ganglionic level infarction (caudate, lentiform nuclei, internal capsule, insula, M1-M3 cortex): 7 - Supraganglionic infarction (M4-M6 cortex): 3 Total score (0-10 with 10 being normal):  10 IMPRESSION: 1. No evidence of acute intracranial abnormality. 2. ASPECTS is 10. 3. Mild chronic small vessel ischemic disease. These results were called by telephone at the time of interpretation on 05/07/2019 at 1:00 pm to Dr. Aletta Edouard , who verbally acknowledged these results. Electronically Signed   By: Logan Bores M.D.   On: 05/07/2019 13:01   Vas US Carotid (at [facility] Only)  Result Date: 05/08/2019 Carotid Arterial Duplex Study Indications:       TIA. Risk Factors:      Hypertension. Comparison Study:  no prior Performing Technologist: Abram Sander RVS  Examination Guidelines: A complete evaluation includes B-mode imaging, spectral Doppler, color Doppler, and power Doppler as needed of all accessible portions of each vessel. Bilateral testing is considered an integral part of a complete examination. Limited examinations for reoccurring indications may be performed as noted.  Right Carotid Findings: +----------+--------+--------+--------+------------+--------+           PSV cm/sEDV cm/sStenosisDescribe    Comments +----------+--------+--------+--------+------------+--------+ CCA Prox  85      16              heterogenous         +----------+--------+--------+--------+------------+--------+ CCA Distal75      23              heterogenous         +----------+--------+--------+--------+------------+--------+ ICA Prox  81      27      1-39%   heterogenous         +----------+--------+--------+--------+------------+--------+ ICA Distal63      22                                   +----------+--------+--------+--------+------------+--------+ ECA  72      7                                    +----------+--------+--------+--------+------------+--------+ +----------+--------+-------+--------+-------------------+           PSV cm/sEDV cmsDescribeArm Pressure (mmHG) +----------+--------+-------+--------+-------------------+ Subclavian100                                         +----------+--------+-------+--------+-------------------+ +---------+--------+--+--------+--+---------+ VertebralPSV cm/s37EDV cm/s10Antegrade +---------+--------+--+--------+--+---------+  Left Carotid Findings: +----------+--------+--------+--------+------------+--------+           PSV cm/sEDV cm/sStenosisDescribe    Comments +----------+--------+--------+--------+------------+--------+ CCA Prox  73      16                                   +----------+--------+--------+--------+------------+--------+ CCA Distal67      16              heterogenous         +----------+--------+--------+--------+------------+--------+ ICA Prox  237     83      60-79%  heterogenous         +----------+--------+--------+--------+------------+--------+ ICA Mid   166     61                                   +----------+--------+--------+--------+------------+--------+ ICA Distal107     23                                   +----------+--------+--------+--------+------------+--------+ ECA       96      14                                   +----------+--------+--------+--------+------------+--------+ +----------+--------+--------+--------+-------------------+ SubclavianPSV cm/sEDV cm/sDescribeArm Pressure (mmHG) +----------+--------+--------+--------+-------------------+           104                                         +----------+--------+--------+--------+-------------------+ +---------+--------+--+--------+-+---------+ VertebralPSV cm/s38EDV cm/s6Antegrade +---------+--------+--+--------+-+---------+  Summary: Right Carotid: Velocities in the right ICA are consistent with a 1-39% stenosis. Left Carotid: Velocities in the left ICA are consistent with a 60-79% stenosis. Vertebrals: Bilateral vertebral arteries demonstrate antegrade flow. *See table(s) above for measurements and observations.     Preliminary        Subjective:  Patient seen and examined on the day of discharge.  Discussed with neurology.  Discussed with radiology and plan for procedure on Monday 7/20.  Patient was educated, scheduled for procedure.  He is eager to go home.  No overnight events.  No similar events since admission   Discharge Exam: Vitals:   05/09/19 0746 05/09/19 1225  BP: 115/65 129/68  Pulse: (!) 53 (!) 46  Resp: 12 14  Temp: 98.6 F (37 C) 97.7 F (36.5 C)  SpO2: 97% 99%   Vitals:   05/09/19 0300 05/09/19 0300 05/09/19 0746 05/09/19 1225  BP: 110/71 110/71 115/65 129/68  Pulse: (!) 40 (!) 40 (!)  53 (!) 46  Resp:  14 12 14   Temp: 98.6 F (37 C) 98.6 F (37 C) 98.6 F (37 C) 97.7 F (36.5 C)  TempSrc: Oral Oral Oral Oral  SpO2: 99% 99% 97% 99%  Weight:      Height:        General: Pt is alert, awake, not in acute distress, walking along the room.  Cardiovascular: RRR, S1/S2 +, no rubs, no gallops Respiratory: CTA bilaterally, no wheezing, no rhonchi Abdominal: Soft, NT, ND, bowel sounds + Extremities: no edema, no cyanosis    The results of significant diagnostics from this hospitalization (including imaging, microbiology, ancillary and laboratory) are listed below for reference.     Microbiology: Recent Results (from the past 240 hour(s))  SARS Coronavirus 2 (CEPHEID - Performed in [place] hospital lab), Hosp Order     Status: None   Collection Time: 05/07/19 12:55 PM   Specimen: Nasopharyngeal Swab  Result Value Ref Range Status   SARS Coronavirus 2 NEGATIVE NEGATIVE Final    Comment: (NOTE) If result is NEGATIVE SARS-CoV-2 target nucleic acids are NOT DETECTED. The SARS-CoV-2 RNA is generally detectable in upper and lower  respiratory specimens during the acute phase of infection. The lowest  concentration of SARS-CoV-2 viral copies this assay can detect is 250  copies / mL. A negative result does not preclude SARS-CoV-2 infection  and should not be used as the sole basis for treatment or other   patient management decisions.  A negative result may occur with  improper specimen collection / handling, submission of specimen other  than nasopharyngeal swab, presence of viral mutation(s) within the  areas targeted by this assay, and inadequate number of viral copies  (<250 copies / mL). A negative result must be combined with clinical  observations, patient history, and epidemiological information. If result is POSITIVE SARS-CoV-2 target nucleic acids are DETECTED. The SARS-CoV-2 RNA is generally detectable in upper and lower  respiratory specimens dur ing the acute phase of infection.  Positive  results are indicative of active infection with SARS-CoV-2.  Clinical  correlation with patient history and other diagnostic information is  necessary to determine patient infection status.  Positive results do  not rule out bacterial infection or co-infection with other viruses. If result is PRESUMPTIVE POSTIVE SARS-CoV-2 nucleic acids MAY BE PRESENT.   A presumptive positive result was obtained on the submitted specimen  and confirmed on repeat testing.  While 2019 novel coronavirus  (SARS-CoV-2) nucleic acids may be present in the submitted sample  additional confirmatory testing may be necessary for epidemiological  and / or clinical management purposes  to differentiate between  SARS-CoV-2 and other Sarbecovirus currently known to infect humans.  If clinically indicated additional testing with an alternate test  methodology [phone]) is advised. The SARS-CoV-2 RNA is generally  detectable in upper and lower respiratory sp ecimens during the acute  phase of infection. The expected result is Negative. Fact Sheet for Patients:  StrictlyIdeas.no Fact Sheet for Healthcare Providers: BankingDealers.co.za This test is not yet approved or cleared by the [place] FDA and has been authorized for detection and/or diagnosis of SARS-CoV-2  by FDA under an Emergency Use Authorization (EUA).  This EUA will remain in effect (meaning this test can be used) for the duration of the COVID-19 declaration under Section 564(b)(1) of the Act, 21 U.S.C. section 360bbb-3(b)(1), unless the authorization is terminated or revoked sooner. Performed at [facility], [address] [place], [place]  [address]      Labs: BNP (last 3 results) No results for input(s): BNP in the last 8760 hours. Basic Metabolic Panel: Recent Labs  Lab 05/07/19 1237  NA 138  K 3.7  CL 103  CO2 23  GLUCOSE 171*  BUN 18  CREATININE 1.08  CALCIUM 9.4   Liver Function Tests: Recent Labs  Lab 05/07/19 1237  AST 23  ALT 24  ALKPHOS 73  BILITOT 0.9  PROT 7.6  ALBUMIN 4.3   No results for input(s): LIPASE, AMYLASE in the last 168 hours. No results for input(s): AMMONIA in the last 168 hours. CBC: Recent Labs  Lab 05/07/19 1237 05/08/19 0414  WBC 6.1 5.5  NEUTROABS 4.8  --   HGB 13.8 13.1  HCT 41.7 39.2  MCV 88.7 87.7  PLT 193 182   Cardiac Enzymes: No results for input(s): CKTOTAL, CKMB, CKMBINDEX, TROPONINI in the last 168 hours. BNP: Invalid input(s): POCBNP CBG: Recent Labs  Lab 05/08/19 1356 05/08/19 1727 05/08/19 2044 05/09/19 0559 05/09/19 1224  GLUCAP 114* 175* 137* 118* 146*   D-Dimer No results for input(s): DDIMER in the last 72 hours. Hgb A1c Recent Labs    05/08/19 0414  HGBA1C 6.8*   Lipid Profile Recent Labs    05/08/19 0414  CHOL 196  HDL 59  LDLCALC 120*  TRIG 87  CHOLHDL 3.3   Thyroid function studies No results for input(s): TSH, T4TOTAL, T3FREE, THYROIDAB in the last 72 hours.  Invalid input(s): FREET3 Anemia work up No results for input(s): VITAMINB12, FOLATE, FERRITIN, TIBC, IRON, RETICCTPCT in the last 72 hours. Urinalysis No results found for: COLORURINE, APPEARANCEUR, LABSPEC, [place], GLUCOSEU, [place], [place], KETONESUR, PROTEINUR, UROBILINOGEN, NITRITE,  LEUKOCYTESUR Sepsis Labs Invalid input(s): PROCALCITONIN,  WBC,  LACTICIDVEN Microbiology Recent Results (from the past 240 hour(s))  SARS Coronavirus 2 (CEPHEID - Performed in [place] hospital lab), Hosp Order     Status: None   Collection Time: 05/07/19 12:55 PM   Specimen: Nasopharyngeal Swab  Result Value Ref Range Status   SARS Coronavirus 2 NEGATIVE NEGATIVE Final    Comment: (NOTE) If result is NEGATIVE SARS-CoV-2 target nucleic acids are NOT DETECTED. The SARS-CoV-2 RNA is generally detectable in upper and lower  respiratory specimens during the acute phase of infection. The lowest  concentration of SARS-CoV-2 viral copies this assay can detect is 250  copies / mL. A negative result does not preclude SARS-CoV-2 infection  and should not be used as the sole basis for treatment or other  patient management decisions.  A negative result may occur with  improper specimen collection / handling, submission of specimen other  than nasopharyngeal swab, presence of viral mutation(s) within the  areas targeted by this assay, and inadequate number of viral copies  (<250 copies / mL). A negative result must be combined with clinical  observations, patient history, and epidemiological information. If result is POSITIVE SARS-CoV-2 target nucleic acids are DETECTED. The SARS-CoV-2 RNA is generally detectable in upper and lower  respiratory specimens dur ing the acute phase of infection.  Positive  results are indicative of active infection with SARS-CoV-2.  Clinical  correlation with patient history and other diagnostic information is  necessary to determine patient infection status.  Positive results do  not rule out bacterial infection or co-infection with other viruses. If result is PRESUMPTIVE POSTIVE SARS-CoV-2 nucleic acids MAY BE PRESENT.   A presumptive positive result was obtained on the submitted specimen  and confirmed on repeat testing.  While  2019 novel coronavirus   (SARS-CoV-2) nucleic acids may be present in the submitted sample  additional confirmatory testing may be necessary for epidemiological  and / or clinical management purposes  to differentiate between  SARS-CoV-2 and other Sarbecovirus currently known to infect humans.  If clinically indicated additional testing with an alternate test  methodology [phone]) is advised. The SARS-CoV-2 RNA is generally  detectable in upper and lower respiratory sp ecimens during the acute  phase of infection. The expected result is Negative. Fact Sheet for Patients:  StrictlyIdeas.no Fact Sheet for Healthcare Providers: BankingDealers.co.za This test is not yet approved or cleared by the [place] FDA and has been authorized for detection and/or diagnosis of SARS-CoV-2 by FDA under an Emergency Use Authorization (EUA).  This EUA will remain in effect (meaning this test can be used) for the duration of the COVID-19 declaration under Section 564(b)(1) of the Act, 21 U.S.C. section 360bbb-3(b)(1), unless the authorization is terminated or revoked sooner. Performed at [facility], [address] [place], [place] [address]      Time coordinating discharge: 35 minutes  SIGNED:   Barb Merino, MD  [facility] 05/09/2019, 3:56 PM

## 2019-05-09 NOTE — Progress Notes (Signed)
NIR.  Patient presented to [facility] ED 05/07/2019 with complaints of headache, blurred vision, and RUE weakness. States that he was at home working on his computer and developed a dull headache, blurred vision, and right hand weakness. States that 5-10 minutes later, right hand weakness subsided. States blurred vision persisted for approximately 1 hour, and then subsided. States that headache persisted and also developed pain behind left eye. 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 CTA head/neck revealed left ICA stenosis, thought to be cause of stroke. Neurology was consulted and recommends NIR consult for management of left ICA stenosis.  Patient awake and alert laying in bed. Complains of dull headache, stable since admission. Denies weakness, numbness/tingling, dizziness, vision changes, hearing changes, tinnitus, or speech difficulty.  Discussed results of CTA and explained that left ICA stenosis thought to be cause of CVA. Recommended an image-guided diagnostic cerebral arteriogram with intent to treat left ICA stenosis ASAP on outpatient basis- patient is up for discharge today per neurology. Discussed procedure, including risks and benefits. Patient expresses desire to move forward with procedure. Explained that our schedulers will call him to set up this procedure. All questions answered and concerns addressed.  Please call NIR with questions/concerns.   Bea Graff Sonali Wivell, PA-C 05/09/2019, 2:33 PM

## 2019-05-10 ENCOUNTER — Other Ambulatory Visit (HOSPITAL_COMMUNITY): Payer: BC Managed Care – PPO

## 2019-05-12 ENCOUNTER — Other Ambulatory Visit: Payer: Self-pay

## 2019-05-12 ENCOUNTER — Ambulatory Visit (HOSPITAL_COMMUNITY)
Admission: RE | Admit: 2019-05-12 | Discharge: 2019-05-12 | Disposition: A | Discharge status: Home / Self Care | Payer: BC Managed Care – PPO | Source: Ambulatory Visit | Attending: Neurology | Admitting: Neurology

## 2019-05-12 ENCOUNTER — Ambulatory Visit (HOSPITAL_COMMUNITY)
Admission: RE | Admit: 2019-05-12 | Discharge: 2019-05-12 | Disposition: A | Discharge status: Home / Self Care | Payer: BC Managed Care – PPO | Source: Ambulatory Visit | Attending: Interventional Radiology | Admitting: Interventional Radiology

## 2019-05-12 ENCOUNTER — Ambulatory Visit (HOSPITAL_COMMUNITY): Payer: BC Managed Care – PPO | Admitting: Certified Registered Nurse Anesthetist

## 2019-05-12 ENCOUNTER — Ambulatory Visit (HOSPITAL_COMMUNITY): Admit: 2019-05-12 | Payer: BC Managed Care – PPO

## 2019-05-12 ENCOUNTER — Encounter (HOSPITAL_COMMUNITY): Payer: Self-pay | Admitting: *Deleted

## 2019-05-12 ENCOUNTER — Encounter (HOSPITAL_COMMUNITY)
Admission: RE | Disposition: A | Discharge status: Home / Self Care | Payer: Self-pay | Source: Ambulatory Visit | Attending: Interventional Radiology

## 2019-05-12 DIAGNOSIS — Z7982 Long term (current) use of aspirin: Secondary | ICD-10-CM | POA: Diagnosis not present

## 2019-05-12 DIAGNOSIS — Z79899 Other long term (current) drug therapy: Secondary | ICD-10-CM | POA: Insufficient documentation

## 2019-05-12 DIAGNOSIS — E119 Type 2 diabetes mellitus without complications: Secondary | ICD-10-CM | POA: Diagnosis not present

## 2019-05-12 DIAGNOSIS — Z8673 Personal history of transient ischemic attack (TIA), and cerebral infarction without residual deficits: Secondary | ICD-10-CM | POA: Insufficient documentation

## 2019-05-12 DIAGNOSIS — K219 Gastro-esophageal reflux disease without esophagitis: Secondary | ICD-10-CM | POA: Diagnosis not present

## 2019-05-12 DIAGNOSIS — E039 Hypothyroidism, unspecified: Secondary | ICD-10-CM | POA: Insufficient documentation

## 2019-05-12 DIAGNOSIS — Z7984 Long term (current) use of oral hypoglycemic drugs: Secondary | ICD-10-CM | POA: Insufficient documentation

## 2019-05-12 DIAGNOSIS — Z7901 Long term (current) use of anticoagulants: Secondary | ICD-10-CM | POA: Diagnosis not present

## 2019-05-12 DIAGNOSIS — Z85810 Personal history of malignant neoplasm of tongue: Secondary | ICD-10-CM | POA: Diagnosis not present

## 2019-05-12 DIAGNOSIS — I10 Essential (primary) hypertension: Secondary | ICD-10-CM | POA: Diagnosis not present

## 2019-05-12 DIAGNOSIS — I63132 Cerebral infarction due to embolism of left carotid artery: Secondary | ICD-10-CM | POA: Diagnosis not present

## 2019-05-12 DIAGNOSIS — I6522 Occlusion and stenosis of left carotid artery: Secondary | ICD-10-CM | POA: Diagnosis not present

## 2019-05-12 DIAGNOSIS — Z7902 Long term (current) use of antithrombotics/antiplatelets: Secondary | ICD-10-CM | POA: Diagnosis not present

## 2019-05-12 DIAGNOSIS — Z79890 Hormone replacement therapy (postmenopausal): Secondary | ICD-10-CM | POA: Insufficient documentation

## 2019-05-12 DIAGNOSIS — I6529 Occlusion and stenosis of unspecified carotid artery: Secondary | ICD-10-CM

## 2019-05-12 HISTORY — PX: IR US GUIDE VASC ACCESS RIGHT: IMG2390

## 2019-05-12 HISTORY — PX: IR ANGIOGRAM EXTREMITY LEFT: IMG651

## 2019-05-12 HISTORY — DX: Other specified postprocedural states: Z98.890

## 2019-05-12 HISTORY — PX: IR ANGIO INTRA EXTRACRAN SEL COM CAROTID INNOMINATE BILAT MOD SED: IMG5360

## 2019-05-12 HISTORY — DX: Nausea with vomiting, unspecified: R11.2

## 2019-05-12 HISTORY — DX: Cerebral infarction, unspecified: I63.9

## 2019-05-12 HISTORY — PX: IR ANGIO VERTEBRAL SEL VERTEBRAL BILAT MOD SED: IMG5369

## 2019-05-12 LAB — APTT: aPTT: 28 seconds (ref 24–36)

## 2019-05-12 LAB — PLATELET INHIBITION P2Y12: Platelet Function  P2Y12: 241 [PRU] (ref 182–335)

## 2019-05-12 LAB — CBC WITH DIFFERENTIAL/PLATELET
Abs Immature Granulocytes: 0.01 10*3/uL (ref 0.00–0.07)
Basophils Absolute: 0 10*3/uL (ref 0.0–0.1)
Basophils Relative: 0 %
Eosinophils Absolute: 0.2 10*3/uL (ref 0.0–0.5)
Eosinophils Relative: 3 %
HCT: 40.6 % (ref 39.0–52.0)
Hemoglobin: 13.4 g/dL (ref 13.0–17.0)
Immature Granulocytes: 0 %
Lymphocytes Relative: 21 %
Lymphs Abs: 1.1 10*3/uL (ref 0.7–4.0)
MCH: 29.2 pg (ref 26.0–34.0)
MCHC: 33 g/dL (ref 30.0–36.0)
MCV: 88.5 fL (ref 80.0–100.0)
Monocytes Absolute: 0.5 10*3/uL (ref 0.1–1.0)
Monocytes Relative: 9 %
Neutro Abs: 3.4 10*3/uL (ref 1.7–7.7)
Neutrophils Relative %: 67 %
Platelets: 194 10*3/uL (ref 150–400)
RBC: 4.59 MIL/uL (ref 4.22–5.81)
RDW: 12.6 % (ref 11.5–15.5)
WBC: 5.1 10*3/uL (ref 4.0–10.5)
nRBC: 0 % (ref 0.0–0.2)

## 2019-05-12 LAB — PROTIME-INR
INR: 1 (ref 0.8–1.2)
Prothrombin Time: 12.9 seconds (ref 11.4–15.2)

## 2019-05-12 LAB — BASIC METABOLIC PANEL
Anion gap: 8 (ref 5–15)
BUN: 20 mg/dL (ref 8–23)
CO2: 25 mmol/L (ref 22–32)
Calcium: 9.2 mg/dL (ref 8.9–10.3)
Chloride: 108 mmol/L (ref 98–111)
Creatinine, Ser: 1.23 mg/dL (ref 0.61–1.24)
GFR calc Af Amer: >60 mL/min (ref >60)
GFR calc non Af Amer: >60 mL/min (ref >60)
Glucose, Bld: 164 mg/dL — ABNORMAL HIGH (ref 70–99)
Potassium: 3.6 mmol/L (ref 3.5–5.1)
Sodium: 141 mmol/L (ref 135–145)

## 2019-05-12 LAB — GLUCOSE, CAPILLARY
Glucose-Capillary: 136 mg/dL — ABNORMAL HIGH (ref 70–99)
Glucose-Capillary: 162 mg/dL — ABNORMAL HIGH (ref 70–99)

## 2019-05-12 SURGERY — IR WITH ANESTHESIA
Anesthesia: General

## 2019-05-12 MED ORDER — EPINEPHRINE 1 MG/10ML IJ SOSY
PREFILLED_SYRINGE | INTRAMUSCULAR | Status: AC
  Filled 2019-05-12: qty 10

## 2019-05-12 MED ORDER — MIDAZOLAM HCL 2 MG/2ML IJ SOLN
INTRAMUSCULAR | Status: AC | PRN
  Administered 2019-05-12: 1 mg via INTRAVENOUS

## 2019-05-12 MED ORDER — SCOPOLAMINE 1 MG/3DAYS TD PT72
MEDICATED_PATCH | TRANSDERMAL | Status: AC
  Filled 2019-05-12: qty 1

## 2019-05-12 MED ORDER — IOHEXOL 300 MG/ML  SOLN: 84.0000 mL | Freq: Once | INTRAMUSCULAR | Status: DC | PRN

## 2019-05-12 MED ORDER — SODIUM CHLORIDE 0.9 % IV SOLN: INTRAVENOUS | Status: DC

## 2019-05-12 MED ORDER — CLOPIDOGREL BISULFATE 75 MG PO TABS: 75.0000 mg | ORAL_TABLET | ORAL | Status: DC

## 2019-05-12 MED ORDER — ATROPINE SULFATE 1 MG/10ML IJ SOSY
PREFILLED_SYRINGE | INTRAMUSCULAR | Status: AC
  Filled 2019-05-12: qty 10

## 2019-05-12 MED ORDER — FENTANYL CITRATE (PF) 100 MCG/2ML IJ SOLN
INTRAMUSCULAR | Status: AC
  Filled 2019-05-12: qty 2

## 2019-05-12 MED ORDER — HEPARIN SODIUM (PORCINE) 1000 UNIT/ML IJ SOLN
INTRAMUSCULAR | Status: AC
  Administered 2019-05-12: 2000 [IU]
  Filled 2019-05-12: qty 1

## 2019-05-12 MED ORDER — VERAPAMIL HCL 2.5 MG/ML IV SOLN
INTRAVENOUS | Status: AC
  Administered 2019-05-12: 09:00:00 2.5 mg
  Filled 2019-05-12: qty 2

## 2019-05-12 MED ORDER — MIDAZOLAM HCL 2 MG/2ML IJ SOLN
INTRAMUSCULAR | Status: AC
  Filled 2019-05-12: qty 2

## 2019-05-12 MED ORDER — FENTANYL CITRATE (PF) 100 MCG/2ML IJ SOLN
INTRAMUSCULAR | Status: AC
  Filled 2019-05-12: qty 4

## 2019-05-12 MED ORDER — SODIUM CHLORIDE 0.9 % IV SOLN
INTRAVENOUS | Status: AC
  Administered 2019-05-12: 10:00:00 via INTRAVENOUS

## 2019-05-12 MED ORDER — NITROGLYCERIN 1 MG/10 ML FOR IR/CATH LAB
INTRA_ARTERIAL | Status: AC
  Administered 2019-05-12: 400 ug
  Filled 2019-05-12: qty 10

## 2019-05-12 MED ORDER — FENTANYL CITRATE (PF) 100 MCG/2ML IJ SOLN
INTRAMUSCULAR | Status: AC | PRN
  Administered 2019-05-12: 25 ug via INTRAVENOUS

## 2019-05-12 MED ORDER — NIMODIPINE 30 MG PO CAPS: 0.0000 mg | ORAL_CAPSULE | ORAL | Status: DC

## 2019-05-12 MED ORDER — LIDOCAINE HCL 1 % IJ SOLN
INTRAMUSCULAR | Status: AC
  Filled 2019-05-12: qty 20

## 2019-05-12 MED ORDER — LIDOCAINE HCL (PF) 1 % IJ SOLN
INTRAMUSCULAR | Status: AC | PRN
  Administered 2019-05-12: 5 mL

## 2019-05-12 MED ORDER — SCOPOLAMINE 1 MG/3DAYS TD PT72: 1.0000 | MEDICATED_PATCH | TRANSDERMAL | Status: DC

## 2019-05-12 MED ORDER — ASPIRIN EC 325 MG PO TBEC: 325.0000 mg | DELAYED_RELEASE_TABLET | ORAL | Status: DC

## 2019-05-12 NOTE — Progress Notes (Signed)
NIR.  Patient underwent an image-guided diagnostic cerebral arteriogram this AM with Dr. Estanislado Pandy. Findings revealed approximately 70% stenosis of left ICA proximally.   Dr. Estanislado Pandy reviewed these findings with patient and recommended endovascular revascularization and patient expresses desire to move forward with procedure. Informed patient that our schedulers will call him to set up this procedure.   Starting tomorrow 05/13/2019, patient to discontinue taking Plavix/Aspirin 325 mg and begin taking Brilinta 90 mg taken twice daily and Aspirin 81 mg taken once daily. Samples of Brilinta given to patient. Phoned in prescription to [facility] in [place], [place] [phone]) at 1136- Brilinta 90 mg tablets, take one tablet by mouth twice daily, dispense 60 tablets with 3 refills.  All questions answered and concerns addressed. Patient conveys understanding and agrees with plan.   Bea Graff Louk, PA-C 05/12/2019, 11:37 AM

## 2019-05-12 NOTE — Anesthesia Preprocedure Evaluation (Deleted)
Anesthesia Evaluation  Patient identified by MRN, date of birth, ID band Patient awake    Reviewed: Allergy & Precautions, NPO status , Patient's Chart, lab work & pertinent test results  History of Anesthesia Complications (+) PONV and history of anesthetic complications  Airway Mallampati: II       Dental no notable dental hx. (+) Teeth Intact   Pulmonary neg pulmonary ROS,    Pulmonary exam normal breath sounds clear to auscultation       Cardiovascular hypertension, Pt. on medications Normal cardiovascular exam Rhythm:Regular Rate:Normal     Neuro/Psych negative psych ROS   GI/Hepatic Neg liver ROS, GERD  Medicated and Controlled,  Endo/Other  diabetes, Type 2, Oral Hypoglycemic AgentsHypothyroidism   Renal/GU negative Renal ROS     Musculoskeletal negative musculoskeletal ROS (+)   Abdominal Normal abdominal exam  (+)   Peds  Hematology negative hematology ROS (+)   Anesthesia Other Findings Result status: Final result    ECHOCARDIOGRAM REPORT       Patient Name:   Henry Bridges Date of Exam: 05/08/2019 Medical Rec #:  [id number]        Height:       70.0 in Accession #:    [id number]       Weight:       231.5 lb Date of Birth:  1951-02-19        BSA:          2.22 m Patient Age:    68 years         BP:           128/69 mmHg Patient Gender: M                HR:           54 bpm. Exam Location:  Inpatient    Procedure: 2D Echo, Color Doppler and Cardiac Doppler  Indications:    TIA   History:        Patient has no prior history of Echocardiogram examinations.                 Risk Factors: Hypertension and Diabetes.   Sonographer:    Dustin Flock Referring Phys: [phone] [place]    1. The left ventricle has normal systolic function, with an ejection fraction of 55-60%. The cavity size was normal. Left ventricular diastolic parameters were normal.  2. The right  ventricle has normal systolic function. The cavity was normal. There is no increase in right ventricular wall thickness. Right ventricular systolic pressure is normal with an estimated pressure of 43.2 mmHg.  3. Left atrial size was mildly dilated.  4. Mild thickening of the mitral valve leaflet. Mild calcification of the mitral valve leaflet.  5. The aortic valve is tricuspid. Mild thickening of the aortic valve. Mild calcification of the aortic valve.  6. The interatrial septum was not well visualized.  FINDINGS  Left Ventricle: The left ventricle has normal systolic function, with an ejection fraction of 55-60%. The cavity size was normal. There is no increase in left ventricular wall thickness. Left ventricular diastolic parameters were normal.  Right Ventricle: The right ventricle has normal systolic function. The cavity was normal. There is no increase in right ventricular wall thickness. Right ventricular systolic pressure is normal with an estimated pressure of 43.2 mmHg.  Left Atrium: Left atrial size was mildly dilated.  Right Atrium: Right atrial size was normal in size. Right atrial pressure is  estimated at 10 mmHg.  Interatrial Septum: The interatrial septum was not well visualized.  Pericardium: There is no evidence of pericardial effusion.  Mitral Valve: The mitral valve is normal in structure. Mild thickening of the mitral valve leaflet. Mild calcification of the mitral valve leaflet. Mitral valve regurgitation is mild by color flow Doppler.  Tricuspid Valve: The tricuspid valve is normal in structure. Tricuspid valve regurgitation is mild by color flow Doppler.  Aortic Valve: The aortic valve is tricuspid Mild thickening of the aortic valve. Mild calcification of the aortic valve. Aortic valve regurgitation was not visualized by color flow Doppler. There is no evidence of aortic valve stenosis.  Pulmonic Valve: The pulmonic valve was grossly normal. Pulmonic valve  regurgitation is mild by color flow Doppler.    +--------------+--------++ LEFT VENTRICLE         +----------------+---------++ +--------------+--------++ Diastology                PLAX 2D                +----------------+---------++ +--------------+--------++ LV e' lateral:  8.38 cm/s LVIDd:        6.00 cm  +----------------+---------++ +--------------+--------++ LV E/e' lateral:8.0       LVIDs:        4.23 cm  +----------------+---------++ +--------------+--------++ LV e' medial:   5.55 cm/s LV PW:        1.11 cm  +----------------+---------++ +--------------+--------++ LV E/e' medial: 12.0      LV IVS:       1.11 cm  +----------------+---------++ +--------------+--------++ LVOT diam:    2.00 cm  +--------------+--------++ LV SV:        100 ml   +--------------+--------++ LV SV Index:  43.31    +--------------+--------++ LVOT Area:    3.14 cm +--------------+--------++                        +--------------+--------++  +---------------+----------++ RIGHT VENTRICLE           +---------------+----------++ RV Basal diam: 3.58 cm    +---------------+----------++ RV S prime:    11.70 cm/s +---------------+----------++ TAPSE (M-mode):2.9 cm     +---------------+----------++ RVSP:          36.2 mmHg  +---------------+----------++  +---------------+-------++-----------++ LEFT ATRIUM           Index       +---------------+-------++-----------++ LA diam:       4.30 cm1.94 cm/m  +---------------+-------++-----------++ LA Vol (A2C):  74.2 ml33.41 ml/m +---------------+-------++-----------++ LA Vol (A4C):  84.9 ml38.22 ml/m +---------------+-------++-----------++ LA Biplane Vol:79.9 ml35.97 ml/m +---------------+-------++-----------++ +------------+---------++-----------++ RIGHT ATRIUM         Index       +------------+---------++-----------++ RA  Pressure:3.00 mmHg            +------------+---------++-----------++ RA Area:    20.20 cm            +------------+---------++-----------++ RA Volume:  63.90 ml 28.77 ml/m +------------+---------++-----------++  +------------+-----------++ AORTIC VALVE            +------------+-----------++ LVOT Vmax:  129.00 cm/s +------------+-----------++ LVOT Vmean: 81.600 cm/s +------------+-----------++ LVOT VTI:   0.295 m     +------------+-----------++   +-------------+-------++ AORTA                +-------------+-------++ Ao Root diam:2.90 cm +-------------+-------++  +--------------+----------++ +---------------+-----------++ MITRAL VALVE             TRICUSPID VALVE            +--------------+----------++ +---------------+-----------++  MV Area (PHT):3.31 cm   TR Peak grad:  33.2 mmHg   +--------------+----------++ +---------------+-----------++ MV PHT:       66.41 msec TR Vmax:       288.00 cm/s +--------------+----------++ +---------------+-----------++ MV Decel Time:229 msec   Estimated RAP: 3.00 mmHg   +--------------+----------++ +---------------+-----------++ +--------------+----------++ RVSP:          36.2 mmHg   MV E velocity:66.80 cm/s +---------------+-----------++ +--------------+----------++ MV A velocity:48.80 cm/s +--------------+-------+ +--------------+----------++ SHUNTS                MV E/A ratio: 1.37       +--------------+-------+ +--------------+----------++ Systemic VTI: 0.30 m                               +--------------+-------+                              Systemic Diam:2.00 cm                              +--------------+-------+    Jenkins Rouge MD Electronically signed by Jenkins Rouge MD Signature Date/Time: 05/08/2019/12:19:18 PM       Final   Syngo Images  Show images for ECHOCARDIOGRAM COMPLETE Images on [employer]  Show  images for Henry Bridges Performing Technologist/Nurse  Performing Technologist/Nurse: Matilde Bash Reason for Exam Priority: Anticipated Discharge Comments:  Patient Data  Height  70 in  BP  128/69 mmHg               Surgical History  Surgical History   No past medical history on file.  Other Surgical History   Procedure Laterality Date Comment Source bone spur    Provider CYST EXCISION   on tailbone Provider FOOT SURGERY   lump of veins that was removed Provider SHOULDER ARTHROSCOPY Bilateral  to clean out bone spurs Provider TONGUE SURGERY    Provider  Implants   No active implants to display in this view. Order-Level Documents:  There are no order-level documents. Encounter-Level Documents - 05/07/2019:  Document on 05/11/2019 7:32 PM by Lennox Laity, RN: ED PB Billing Extract Document on 05/09/2019 5:02 PM by Clint Bolder, RN: IP After Visit Summary Scan on 05/09/2019 7:30 AM by Default, Provider, MD Scan on 05/09/2019 9:41 AM by Default, Provider, MD Electronic signature on 05/07/2019 6:27 PM - 2 of 7 e-signatures recorded Electronic signature on 05/07/2019 2:26 PM - E-signed   Resulted by:  Signed Date/Time  Phone Pager Josue Hector 05/08/2019 12:19 PM [phone]  External Result Report   External Result Report    Summary: Right Carotid: Velocities in the right ICA are consistent with a 1-39% stenosis.  Left Carotid: Velocities in the left ICA are consistent with a 60-79% stenosis.  Vertebrals: Bilateral vertebral arteries demonstrate antegrade flow.  *See table(s) above for measurements and observations.    Reproductive/Obstetrics                           Anesthesia Physical Anesthesia Plan  ASA: III  Anesthesia Plan: General   Post-op Pain Management:    Induction: Intravenous  PONV Risk Score and Plan: Ondansetron, Midazolam, Treatment may vary due to age or medical  condition and Scopolamine patch - Pre-op  Airway Management Planned: Oral ETT  Additional Equipment: Arterial line  Intra-op Plan:   Post-operative Plan:   Informed Consent: I have reviewed the patients History and Physical, chart, labs and discussed the procedure including the risks, benefits and alternatives for the proposed anesthesia with the patient or authorized representative who has indicated his/her understanding and acceptance.     Dental advisory given  Plan Discussed with: CRNA  Anesthesia Plan Comments:         Anesthesia Quick Evaluation

## 2019-05-12 NOTE — Discharge Instructions (Signed)
Keep right arm at or above heart level Drink Plenty of fluids  Radial Site Care  This sheet gives you information about how to care for yourself after your procedure. Your health care provider may also give you more specific instructions. If you have problems or questions, contact your health care provider. What can I expect after the procedure? After the procedure, it is common to have:  Bruising and tenderness at the catheter insertion area. Follow these instructions at home: Medicines  Take over-the-counter and prescription medicines only as told by your health care provider. Insertion site care  Follow instructions from your health care provider about how to take care of your insertion site. Make sure you: ? Wash your hands with soap and water before you change your bandage (dressing). If soap and water are not available, use hand sanitizer. ? Change your dressing as told by your health care provider. ? Leave stitches (sutures), skin glue, or adhesive strips in place. These skin closures may need to stay in place for 2 weeks or longer. If adhesive strip edges start to loosen and curl up, you may trim the loose edges. Do not remove adhesive strips completely unless your health care provider tells you to do that.  Check your insertion site every day for signs of infection. Check for: ? Redness, swelling, or pain. ? Fluid or blood. ? Pus or a bad smell. ? Warmth.  Do not take baths, swim, or use a hot tub until your health care provider approves.  You may shower 24-48 hours after the procedure, or as directed by your health care provider. ? Remove the dressing and gently wash the site with plain soap and water. ? Pat the area dry with a clean towel. ? Do not rub the site. That could cause bleeding.  Do not apply powder or lotion to the site. Activity   For 24 hours after the procedure, or as directed by your health care provider: ? Do not flex or bend the affected arm. ? Do  not push or pull heavy objects with the affected arm. ? Do not drive yourself home from the hospital or clinic. You may drive 24 hours after the procedure unless your health care provider tells you not to. ? Do not operate machinery or power tools.  Do not lift anything that is heavier than 10 lb (4.5 kg), or the limit that you are told, until your health care provider says that it is safe.  Ask your health care provider when it is okay to: ? Return to work or school. ? Resume usual physical activities or sports. ? Resume sexual activity. General instructions  If the catheter site starts to bleed, raise your arm and put firm pressure on the site. If the bleeding does not stop, get help right away. This is a medical emergency.  If you went home on the same day as your procedure, a responsible adult should be with you for the first 24 hours after you arrive home.  Keep all follow-up visits as told by your health care provider. This is important. Contact a health care provider if:  You have a fever.  You have redness, swelling, or yellow drainage around your insertion site. Get help right away if:  You have unusual pain at the radial site.  The catheter insertion area swells very fast.  The insertion area is bleeding, and the bleeding does not stop when you hold steady pressure on the area.  Your arm or hand  becomes pale, cool, tingly, or numb. °These symptoms may represent a serious problem that is an emergency. Do not wait to see if the symptoms will go away. Get medical help right away. Call your local emergency services (911 in the [place] Do not drive yourself to the hospital. °Summary °· After the procedure, it is common to have bruising and tenderness at the site. °· Follow instructions from your health care provider about how to take care of your radial site wound. Check the wound every day for signs of infection. °· Do not lift anything that is heavier than 10 lb (4.5 kg), or the  limit that you are told, until your health care provider says that it is safe. °This information is not intended to replace advice given to you by your health care provider. Make sure you discuss any questions you have with your health care provider. °Document Released: 11/11/2010 Document Revised: 11/14/2017 Document Reviewed: 11/14/2017 °Elsevier Patient Education © 2020 [employer] ° °

## 2019-05-12 NOTE — Progress Notes (Addendum)
Ambulated to bathroom with assistance  to void tol well

## 2019-05-12 NOTE — Progress Notes (Signed)
Ambulated to bathroom with assistance to void  tol well

## 2019-05-12 NOTE — Progress Notes (Addendum)
Allie, PA called and asked her to call pt wife and daughter to update them. Daughter Nira Conn [phone] Discharge instructions reviewed with pt, daughter, and wife. All voice understanding.

## 2019-05-12 NOTE — H&P (Signed)
Chief Complaint: Patient was seen in consultation today for left ICA stenosis/diagnostic cerebral arteriogram.  Referring Physician(s): Rosalin Hawking  Supervising Physician: Luanne Bras  Patient Status: [facility] - Out-pt  History of Present Illness: SAIVON PROWSE is a 68 y.o. male with a past medical history of hypertension, TIA 04/2019, GERD, tongue cancer, diabetes mellitus, and hypothyroidism. He was recently admitted to [facility] 05/07/2019 to 05/09/2019 for management of TIA secondary to left ICA stenosis. During admission, patient was seen by neurology who recommends NIR consultation for management of left ICA stenosis on an outpatient basis. He was discharged home 05/09/2019 in stable condition.  CTA head/neck 05/08/2019: 1. 50% diameter stenosis proximal left internal carotid artery. 2. Right carotid artery widely patent. Both vertebral arteries widely patent. 3. Negative for intracranial large vessel occlusion or significant stenosis.  Patient presents today for an image-guided diagnostic cerebral arteriogram with possible revascularization/angioplasty/stent placement of left ICA stenosis. Patient awake and alert laying in bed with no complaints at this time. Denies fever, chills, chest pain, dyspnea, abdominal pain, headache, dizziness, weakness/numbness/tingling, or vision changes.  Patient is currently taking Plavix 75 mg once daily and Aspirin 325 mg once daily.   Past Medical History:  Diagnosis Date   Diabetes mellitus without complication (HCC)    GERD (gastroesophageal reflux disease)    Hypertension    Hypothyroidism    PONV (postoperative nausea and vomiting)    Stroke ([place])    TIA   Tongue cancer (HCC)     Past Surgical History:  Procedure Laterality Date   bone spur     CYST EXCISION     on tailbone   FOOT SURGERY     lump of veins that was removed   SHOULDER ARTHROSCOPY Bilateral    to clean out bone spurs   TONGUE SURGERY       Allergies: Aspirin  Medications: Prior to Admission medications   Medication Sig Start Date End Date Taking? Authorizing Provider  amlodipine-atorvastatin (CADUET) 10-10 MG tablet Take 1 tablet by mouth daily.    [provider]  aspirin EC 325 MG EC tablet Take 1 tablet (325 mg total) by mouth daily. 05/10/19   Barb Merino, MD  atorvastatin (LIPITOR) 20 MG tablet Take 2 tablets (40 mg total) by mouth daily at 6 PM. 05/09/19 08/07/19  Barb Merino, MD  clopidogrel (PLAVIX) 75 MG tablet Take 1 tablet (75 mg total) by mouth daily. 05/10/19 08/08/19  Barb Merino, MD  doxazosin (CARDURA) 2 MG tablet Take 2 mg by mouth daily.    [provider]  hydrochlorothiazide (HYDRODIURIL) 25 MG tablet Take 25 mg by mouth daily.    [provider]  lansoprazole (PREVACID) 30 MG capsule Take 30 mg by mouth daily at 12 noon.    [provider]  levothyroxine (SYNTHROID) 125 MCG tablet Take 125 mcg by mouth daily before breakfast.    [provider]  losartan (COZAAR) 100 MG tablet Take 100 mg by mouth daily.    [provider]  metFORMIN (GLUCOPHAGE) 1000 MG tablet Take 1,000 mg by mouth 2 (two) times daily with a meal.    [provider]     History reviewed. No pertinent family history.  Social History   Socioeconomic History   Marital status: Married    Spouse name: Not on file   Number of children: Not on file   Years of education: Not on file   Highest education level: Not on file  Occupational History  Not on file  Social Needs   Financial resource strain: Not on file   Food insecurity    Worry: Not on file    Inability: Not on file   Transportation needs    Medical: Not on file    Non-medical: Not on file  Tobacco Use   Smoking status: Never Smoker   Smokeless tobacco: Never Used  Substance and Sexual Activity   Alcohol use: Not Currently   Drug use: Never   Sexual activity: Not on file   Lifestyle   Physical activity    Days per week: Not on file    Minutes per session: Not on file   Stress: Not on file  Relationships   Social connections    Talks on phone: Not on file    Gets together: Not on file    Attends religious service: Not on file    Active member of club or organization: Not on file    Attends meetings of clubs or organizations: Not on file    Relationship status: Not on file  Other Topics Concern   Not on file  Social History Narrative   Not on file     Review of Systems: A 12 point ROS discussed and pertinent positives are indicated in the HPI above.  All other systems are negative.  Review of Systems  Constitutional: Negative for chills and fever.  Eyes: Negative for visual disturbance.  Respiratory: Negative for shortness of breath and wheezing.   Cardiovascular: Negative for chest pain and palpitations.  Gastrointestinal: Negative for abdominal pain.  Neurological: Negative for dizziness, weakness, numbness and headaches.  Psychiatric/Behavioral: Negative for confusion and decreased concentration.    Vital Signs: BP 132/67    Pulse (!) 42    Temp 98 F (36.7 C) (Oral)    Resp 18    Ht 5\' 10"  (1.778 m)    Wt 230 lb (104.3 kg)    SpO2 98%    BMI 33.00 kg/m   Physical Exam Vitals signs and nursing note reviewed.  Constitutional:      General: He is not in acute distress.    Appearance: Normal appearance.  Cardiovascular:     Rate and Rhythm: Normal rate and regular rhythm.     Heart sounds: Normal heart sounds. No murmur.  Pulmonary:     Effort: Pulmonary effort is normal. No respiratory distress.     Breath sounds: Normal breath sounds. No wheezing.  Skin:    General: Skin is warm and dry.  Neurological:     Mental Status: He is alert and oriented to person, place, and time.  Psychiatric:        Mood and Affect: Mood normal.        Behavior: Behavior normal.        Thought Content: Thought content normal.        Judgment:  Judgment normal.      MD Evaluation Airway: WNL Heart: WNL Abdomen: WNL Chest/ Lungs: WNL ASA  Classification: 3 Mallampati/Airway Score: One   Imaging: Ct Angio Head W Or Wo Contrast  Result Date: 05/08/2019 CLINICAL DATA:  TIA EXAM: CT ANGIOGRAPHY HEAD AND NECK TECHNIQUE: Multidetector CT imaging of the head and neck was performed using the standard protocol during bolus administration of intravenous contrast. Multiplanar CT image reconstructions and MIPs were obtained to evaluate the vascular anatomy. Carotid stenosis measurements (when applicable) are obtained utilizing NASCET criteria, using the distal internal carotid diameter as the denominator. CONTRAST:  114mL OMNIPAQUE  IOHEXOL 350 MG/ML SOLN COMPARISON:  MRI head 05/08/2019, CT head 05/07/2019 FINDINGS: CTA NECK FINDINGS Aortic arch: 4 vessel arch. Left vertebral artery origin from the arch. No significant atherosclerotic disease dissection or aneurysm in the aortic arch. Proximal great vessels widely patent. Right carotid system: Right carotid widely patent. Minimal atherosclerotic disease right bifurcation Left carotid system: Left common carotid artery widely patent. Atherosclerotic disease in the left carotid bulb and proximal left internal carotid artery. 50% diameter stenosis of the left internal carotid artery above the bulb. Vertebral arteries: Left vertebral non dominant with origin from the arch. Both vertebral arteries are patent the basilar without significant stenosis. Skeleton: No acute skeletal abnormality. Other neck: Negative for mass or adenopathy Upper chest: Lung apices clear bilaterally. Review of the MIP images confirms the above findings CTA HEAD FINDINGS Anterior circulation: Cavernous carotid widely patent bilaterally without stenosis. Anterior and middle cerebral arteries widely patent bilaterally. Posterior circulation: Both vertebral arteries patent to the basilar. PICA patent bilaterally. Basilar widely patent.  Superior cerebellar and posterior cerebral arteries and anterior inferior cerebellar arteries patent bilaterally. Fetal origin right posterior cerebral artery. Venous sinuses: Patent Negative for aneurysm. Review of the MIP images confirms the above findings IMPRESSION: 1. 50% diameter stenosis proximal left internal carotid artery 2. Right carotid artery widely patent. Both vertebral arteries widely patent. 3. Negative for intracranial large vessel occlusion or significant stenosis. Electronically Signed   By: Franchot Gallo M.D.   On: 05/08/2019 19:17   Ct Angio Neck W Or Wo Contrast  Result Date: 05/08/2019 CLINICAL DATA:  TIA EXAM: CT ANGIOGRAPHY HEAD AND NECK TECHNIQUE: Multidetector CT imaging of the head and neck was performed using the standard protocol during bolus administration of intravenous contrast. Multiplanar CT image reconstructions and MIPs were obtained to evaluate the vascular anatomy. Carotid stenosis measurements (when applicable) are obtained utilizing NASCET criteria, using the distal internal carotid diameter as the denominator. CONTRAST:  159mL OMNIPAQUE IOHEXOL 350 MG/ML SOLN COMPARISON:  MRI head 05/08/2019, CT head 05/07/2019 FINDINGS: CTA NECK FINDINGS Aortic arch: 4 vessel arch. Left vertebral artery origin from the arch. No significant atherosclerotic disease dissection or aneurysm in the aortic arch. Proximal great vessels widely patent. Right carotid system: Right carotid widely patent. Minimal atherosclerotic disease right bifurcation Left carotid system: Left common carotid artery widely patent. Atherosclerotic disease in the left carotid bulb and proximal left internal carotid artery. 50% diameter stenosis of the left internal carotid artery above the bulb. Vertebral arteries: Left vertebral non dominant with origin from the arch. Both vertebral arteries are patent the basilar without significant stenosis. Skeleton: No acute skeletal abnormality. Other neck: Negative for mass  or adenopathy Upper chest: Lung apices clear bilaterally. Review of the MIP images confirms the above findings CTA HEAD FINDINGS Anterior circulation: Cavernous carotid widely patent bilaterally without stenosis. Anterior and middle cerebral arteries widely patent bilaterally. Posterior circulation: Both vertebral arteries patent to the basilar. PICA patent bilaterally. Basilar widely patent. Superior cerebellar and posterior cerebral arteries and anterior inferior cerebellar arteries patent bilaterally. Fetal origin right posterior cerebral artery. Venous sinuses: Patent Negative for aneurysm. Review of the MIP images confirms the above findings IMPRESSION: 1. 50% diameter stenosis proximal left internal carotid artery 2. Right carotid artery widely patent. Both vertebral arteries widely patent. 3. Negative for intracranial large vessel occlusion or significant stenosis. Electronically Signed   By: Franchot Gallo M.D.   On: 05/08/2019 19:17   [name] Contrast  Result Date: 05/08/2019 CLINICAL DATA:  68  y/o M; headache, blurred vision, right arm weakness. TIA, initial exam. History of tongue cancer post resection and radiation. EXAM: MRI HEAD WITHOUT CONTRAST TECHNIQUE: Multiplanar, multiecho pulse sequences of the brain and surrounding structures were obtained without intravenous contrast. COMPARISON:  05/07/2019 CT head. FINDINGS: Brain: Mildly increased diffusion signal and mildly decreased ADC of the left caudate nucleus head and body on the axial diffusion sequence (series 8, image 30) and to lesser degree on the coronal diffusion sequence (series 1210, image 22). No associated hemorrhage or mass effect. On the axial DWI sequence there is also increased signal within the left anterior cingulate gyrus, right insula, and right hippocampus, however, this is not replicated on the coronal sequences and may be artifactual. No additional corresponding signal abnormality on other sequences. No acute hemorrhage,  hydrocephalus, extra-axial collection, or mass effect. Scattered punctate nonspecific T2 FLAIR hyperintensities in subcortical and periventricular white matter are compatible with mild chronic microvascular ischemic changes. Mild volume loss of the brain. Vascular: Normal flow voids. Skull and upper cervical spine: Normal marrow signal. Sinuses/Orbits: Negative. Other: None. IMPRESSION: Mildly decreased diffusion of the left caudate nucleus. Findings may represent acute/early subacute infarction, however, the distribution is somewhat atypical for lacunar infarction raising the question of seizure related activity or metabolic/toxic injury. Electronically Signed   By: Kristine Garbe M.D.   On: 05/08/2019 00:24   Mr Jeri Cos HY Contrast  Result Date: 05/08/2019 CLINICAL DATA:  TIA.  Hand weakness.  History of tongue cancer EXAM: MRI HEAD WITHOUT AND WITH CONTRAST TECHNIQUE: Multiplanar, multiecho pulse sequences of the brain and surrounding structures were obtained without and with intravenous contrast. CONTRAST:  10 mL Gadovist IV COMPARISON:  MRI head 05/07/2019 FINDINGS: Brain: Negative for acute infarct. Previously noted signal in the left caudate on axial diffusion imaging no longer present and likely was artifact. Different machines utilized on the 2 studies. Ventricle size normal. Scattered small white matter hyperintensities bilaterally appear chronic. Negative for hemorrhage mass or fluid collection. No midline shift. Normal enhancement. No evidence of metastatic disease. No abnormal enhancement in the left caudate region. Vascular: Normal arterial flow voids Skull and upper cervical spine: Negative Sinuses/Orbits: Negative Other: None IMPRESSION: No acute abnormality Diffusion-weighted signal left caudate on yesterday's study appears to be artifactual. Mild chronic white matter changes likely due to microvascular ischemia. Electronically Signed   By: Franchot Gallo M.D.   On: 05/08/2019 13:16    Ct Head Code Stroke Wo Contrast  Result Date: 05/07/2019 CLINICAL DATA:  Code stroke. Headache above the left eye, blurred vision, and right hand weakness lasting 15-30 minutes. EXAM: CT HEAD WITHOUT CONTRAST TECHNIQUE: Contiguous axial images were obtained from the base of the skull through the vertex without intravenous contrast. COMPARISON:  None. FINDINGS: Brain: There is no evidence of acute infarct, intracranial hemorrhage, mass, midline shift, or extra-axial fluid collection. The ventricles and sulci are within normal limits for age. Patchy hypodensities in the cerebral white matter bilaterally are nonspecific but compatible with mild chronic small vessel ischemic disease. Vascular: No hyperdense vessel. Skull: No fracture or suspicious osseous lesion. Sinuses/Orbits: Visualized paranasal sinuses and mastoid air cells are clear. Visualized orbits are unremarkable. Other: None. ASPECTS [facility] Stroke Program Early CT Score) - Ganglionic level infarction (caudate, lentiform nuclei, internal capsule, insula, M1-M3 cortex): 7 - Supraganglionic infarction (M4-M6 cortex): 3 Total score (0-10 with 10 being normal): 10 IMPRESSION: 1. No evidence of acute intracranial abnormality. 2. ASPECTS is 10. 3. Mild chronic small vessel ischemic disease. These results  were called by telephone at the time of interpretation on 05/07/2019 at 1:00 pm to Dr. Aletta Edouard , who verbally acknowledged these results. Electronically Signed   By: Logan Bores M.D.   On: 05/07/2019 13:01   Vas US Carotid (at [place] Only)  Result Date: 05/10/2019 Carotid Arterial Duplex Study Indications:       TIA. Risk Factors:      Hypertension. Comparison Study:  no prior Performing Technologist: Abram Sander RVS  Examination Guidelines: A complete evaluation includes B-mode imaging, spectral Doppler, color Doppler, and power Doppler as needed of all accessible portions of each vessel. Bilateral testing is considered an integral part of a  complete examination. Limited examinations for reoccurring indications may be performed as noted.  Right Carotid Findings: +----------+--------+--------+--------+------------+--------+             PSV cm/s EDV cm/s Stenosis Describe     Comments  +----------+--------+--------+--------+------------+--------+  CCA Prox   85       16                heterogenous           +----------+--------+--------+--------+------------+--------+  CCA Distal 75       23                heterogenous           +----------+--------+--------+--------+------------+--------+  ICA Prox   81       27       1-39%    heterogenous           +----------+--------+--------+--------+------------+--------+  ICA Distal 63       22                                       +----------+--------+--------+--------+------------+--------+  ECA        72       7                                        +----------+--------+--------+--------+------------+--------+ +----------+--------+-------+--------+-------------------+             PSV cm/s EDV cms Describe Arm Pressure (mmHG)  +----------+--------+-------+--------+-------------------+  Subclavian 100                                            +----------+--------+-------+--------+-------------------+ +---------+--------+--+--------+--+---------+  Vertebral PSV cm/s 37 EDV cm/s 10 Antegrade  +---------+--------+--+--------+--+---------+  Left Carotid Findings: +----------+--------+--------+--------+------------+--------+             PSV cm/s EDV cm/s Stenosis Describe     Comments  +----------+--------+--------+--------+------------+--------+  CCA Prox   73       16                                       +----------+--------+--------+--------+------------+--------+  CCA Distal 67       16                heterogenous           +----------+--------+--------+--------+------------+--------+  ICA Prox   237      83       60-79%  heterogenous           +----------+--------+--------+--------+------------+--------+  ICA  Mid    166      61                                       +----------+--------+--------+--------+------------+--------+  ICA Distal 107      23                                       +----------+--------+--------+--------+------------+--------+  ECA        96       14                                       +----------+--------+--------+--------+------------+--------+ +----------+--------+--------+--------+-------------------+  Subclavian PSV cm/s EDV cm/s Describe Arm Pressure (mmHG)  +----------+--------+--------+--------+-------------------+             104                                             +----------+--------+--------+--------+-------------------+ +---------+--------+--+--------+-+---------+  Vertebral PSV cm/s 38 EDV cm/s 6 Antegrade  +---------+--------+--+--------+-+---------+  Summary: Right Carotid: Velocities in the right ICA are consistent with a 1-39% stenosis. Left Carotid: Velocities in the left ICA are consistent with a 60-79% stenosis. Vertebrals: Bilateral vertebral arteries demonstrate antegrade flow. *See table(s) above for measurements and observations.  Electronically signed by Antony Contras MD on 05/10/2019 at 11:38:51 AM.    Final     Labs:  CBC: Recent Labs    05/07/19 1237 05/08/19 0414 05/12/19 0704  WBC 6.1 5.5 5.1  HGB 13.8 13.1 13.4  HCT 41.7 39.2 40.6  PLT 193 182 194    COAGS: Recent Labs    05/07/19 1237 05/12/19 0704  INR 0.9 1.0  APTT 25 28    BMP: Recent Labs    05/07/19 1237 05/12/19 0704  NA 138 141  K 3.7 3.6  CL 103 108  CO2 23 25  GLUCOSE 171* 164*  BUN 18 20  CALCIUM 9.4 9.2  CREATININE 1.08 1.23  GFRNONAA >60 >60  GFRAA >60 >60    LIVER FUNCTION TESTS: Recent Labs    05/07/19 1237  BILITOT 0.9  AST 23  ALT 24  ALKPHOS 73  PROT 7.6  ALBUMIN 4.3     Assessment and Plan:  Left ICA stenosis. Plan for image-guided diagnostic cerebral arteriogram today with Dr. Estanislado Pandy. Patient is NPO. Afebrile and WBCs WNL. Ok  to proceed with Plavix/Aspirin use per Dr. Estanislado Pandy. INR 1.0 today. P2Y12 241 PRU this AM- per Dr. Estanislado Pandy, diagnostic cerebral only (no intervention) due to P2Y12.  Risks and benefits of cerebral arteriogram were discussed with the patient including, but not limited to bleeding, infection, vascular injury or contrast induced renal failure. This interventional procedure involves the use of X-rays and because of the nature of the planned procedure, it is possible that we will have prolonged use of X-ray fluoroscopy. Potential radiation risks to you include (but are not limited to) the following: - A slightly elevated risk for cancer  several years later in life. This risk is typically less than 0.5% percent.  This risk is low in comparison to the normal incidence of human cancer, which is 33% for women and 50% for men according to the [employer]. - Radiation induced injury can include skin redness, resembling a rash, tissue breakdown / ulcers and hair loss (which can be temporary or permanent).  The likelihood of either of these occurring depends on the difficulty of the procedure and whether you are sensitive to radiation due to previous procedures, disease, or genetic conditions.  IF your procedure requires a prolonged use of radiation, you will be notified and given written instructions for further action.  It is your responsibility to monitor the irradiated area for the 2 weeks following the procedure and to notify your physician if you are concerned that you have suffered a radiation induced injury.   All of the patient's questions were answered, patient is agreeable to proceed. Consent signed and in chart.   Thank you for this interesting consult.  I greatly enjoyed meeting DOMINIE BENEDICK and look forward to participating in their care.  A copy of this report was sent to the requesting provider on this date.  Electronically Signed: Earley Abide, PA-C 05/12/2019, 7:32  AM   I spent a total of 40 Minutes in face to face in clinical consultation, greater than 50% of which was counseling/coordinating care for left ICA stenosis/diagnostic cerebral arteriogram.

## 2019-05-12 NOTE — Procedures (Signed)
S/P 4 vessel cerebral arteriogram RT  Radial approach. Findings. 1.Approx 70 % stenosis of LT ICA prox. S.Ashawnti Tangen MD

## 2019-05-13 ENCOUNTER — Encounter (HOSPITAL_COMMUNITY): Payer: Self-pay | Admitting: Interventional Radiology

## 2019-05-14 ENCOUNTER — Encounter (HOSPITAL_COMMUNITY): Payer: Self-pay

## 2019-05-14 ENCOUNTER — Other Ambulatory Visit (HOSPITAL_COMMUNITY): Payer: Self-pay | Admitting: Interventional Radiology

## 2019-05-14 DIAGNOSIS — I771 Stricture of artery: Secondary | ICD-10-CM

## 2019-05-19 ENCOUNTER — Other Ambulatory Visit (HOSPITAL_COMMUNITY)
Admission: RE | Admit: 2019-05-19 | Discharge: 2019-05-19 | Disposition: A | Discharge status: Home / Self Care | Payer: BC Managed Care – PPO | Source: Ambulatory Visit | Attending: Interventional Radiology | Admitting: Interventional Radiology

## 2019-05-19 DIAGNOSIS — Z20828 Contact with and (suspected) exposure to other viral communicable diseases: Secondary | ICD-10-CM | POA: Insufficient documentation

## 2019-05-19 LAB — SARS CORONAVIRUS 2 (TAT 6-24 HRS): SARS Coronavirus 2: NEGATIVE

## 2019-05-21 ENCOUNTER — Encounter (HOSPITAL_COMMUNITY): Payer: Self-pay | Admitting: *Deleted

## 2019-05-21 ENCOUNTER — Other Ambulatory Visit: Payer: Self-pay | Admitting: Physician Assistant

## 2019-05-21 ENCOUNTER — Other Ambulatory Visit: Payer: Self-pay

## 2019-05-21 NOTE — Progress Notes (Signed)
Pt denies SOB, chest pain, and being under the care of a cardiologist. Pt stated that PCP is Dr. Hulan Fess. Pt denies having a cardiac cath but stated that a stress test was performed " several years ago." Nurse requested LOV note and stress test from Dr. Rex Kras; awaiting response. PT denies having a chest x ray within the last year. Pt made aware to stop taking  vitamins, fish oil and herbal medications. Do not take any NSAIDs ie: Ibuprofen, Advil, Naproxen (Aleve), Motrin, BC and Goody Powder. Pt made aware to not take Metfomin on DOS. Pt stated that he does not check his blood glucose.  Pt stated that he and family members have not tested positive for COVID-19 ( pt tested on 05/19/19 and reminded to quarantine).   Coronavirus Screening  Pt denies that he and family experienced the following symptoms:  Cough yes/no: No Fever (>100.71F)  yes/no: No Runny nose yes/no: No Sore throat yes/no: No Difficulty breathing/shortness of breath  yes/no: No  Have you or a family member traveled in the last 14 days and where? yes/no: No  Pt reminded that hospital visitation restrictions are in effect and the importance of the restrictions.   Pt verbalized understanding of all pre-op instructions.

## 2019-05-21 NOTE — Anesthesia Preprocedure Evaluation (Addendum)
Anesthesia Evaluation  Patient identified by MRN, date of birth, ID band Patient awake    Reviewed: Allergy & Precautions, NPO status , Patient's Chart, lab work & pertinent test results  History of Anesthesia Complications (+) PONV and history of anesthetic complications  Airway Mallampati: II  TM Distance: >3 FB Neck ROM: Full    Dental  (+) Dental Advisory Given, Teeth Intact   Pulmonary neg pulmonary ROS,    breath sounds clear to auscultation       Cardiovascular hypertension, Pt. on medications  Rhythm:Regular Rate:Normal   '20 Carotid US - 60-79% left ICAS, 1-39% right ICAS  '20 TTE - EF 55-60%. Left atrial size was mildly dilated.    Neuro/Psych TIAnegative psych ROS   GI/Hepatic Neg liver ROS, GERD  Medicated and Controlled,  Endo/Other  diabetes, Type 2, Oral Hypoglycemic AgentsHypothyroidism  Obesity   Renal/GU negative Renal ROS     Musculoskeletal  (+) Arthritis ,  Gout    Abdominal   Peds  Hematology negative hematology ROS (+)   Anesthesia Other Findings Tongue cancer s/p surgery    Reproductive/Obstetrics                            Anesthesia Physical Anesthesia Plan  ASA: III  Anesthesia Plan: General and MAC   Post-op Pain Management:    Induction: Intravenous  PONV Risk Score and Plan: 3 and Treatment may vary due to age or medical condition, Ondansetron, Propofol infusion, Dexamethasone and Scopolamine patch - Pre-op  Airway Management Planned: Oral ETT  Additional Equipment: Arterial line  Intra-op Plan:   Post-operative Plan: Extubation in OR  Informed Consent: I have reviewed the patients History and Physical, chart, labs and discussed the procedure including the risks, benefits and alternatives for the proposed anesthesia with the patient or authorized representative who has indicated his/her understanding and acceptance.     Dental advisory  given  Plan Discussed with: CRNA and Anesthesiologist  Anesthesia Plan Comments: (May begin procedure as MAC for imaging purposes and convert to GETA as indicated by procedure)       Anesthesia Quick Evaluation

## 2019-05-22 ENCOUNTER — Encounter (HOSPITAL_COMMUNITY): Payer: Self-pay | Admitting: Certified Registered"

## 2019-05-22 ENCOUNTER — Inpatient Hospital Stay (HOSPITAL_COMMUNITY)
Admission: AD | Admit: 2019-05-22 | Discharge: 2019-05-23 | DRG: 027 | Disposition: A | Discharge status: Home / Self Care | Payer: BC Managed Care – PPO | Attending: Interventional Radiology | Admitting: Interventional Radiology

## 2019-05-22 ENCOUNTER — Encounter (HOSPITAL_COMMUNITY)
Admission: AD | Disposition: A | Discharge status: Home / Self Care | Payer: Self-pay | Source: Home / Self Care | Attending: Interventional Radiology

## 2019-05-22 ENCOUNTER — Ambulatory Visit (HOSPITAL_COMMUNITY): Payer: BC Managed Care – PPO | Admitting: Certified Registered"

## 2019-05-22 ENCOUNTER — Ambulatory Visit (HOSPITAL_COMMUNITY)
Admission: RE | Admit: 2019-05-22 | Discharge: 2019-05-22 | Disposition: A | Discharge status: Home / Self Care | Payer: BC Managed Care – PPO | Source: Ambulatory Visit | Attending: Interventional Radiology | Admitting: Interventional Radiology

## 2019-05-22 DIAGNOSIS — Z8249 Family history of ischemic heart disease and other diseases of the circulatory system: Secondary | ICD-10-CM

## 2019-05-22 DIAGNOSIS — Z886 Allergy status to analgesic agent status: Secondary | ICD-10-CM | POA: Diagnosis not present

## 2019-05-22 DIAGNOSIS — I10 Essential (primary) hypertension: Secondary | ICD-10-CM | POA: Diagnosis not present

## 2019-05-22 DIAGNOSIS — Z7984 Long term (current) use of oral hypoglycemic drugs: Secondary | ICD-10-CM

## 2019-05-22 DIAGNOSIS — Z79899 Other long term (current) drug therapy: Secondary | ICD-10-CM

## 2019-05-22 DIAGNOSIS — E039 Hypothyroidism, unspecified: Secondary | ICD-10-CM | POA: Diagnosis present

## 2019-05-22 DIAGNOSIS — I6522 Occlusion and stenosis of left carotid artery: Secondary | ICD-10-CM | POA: Diagnosis present

## 2019-05-22 DIAGNOSIS — Z9889 Other specified postprocedural states: Secondary | ICD-10-CM | POA: Diagnosis not present

## 2019-05-22 DIAGNOSIS — E119 Type 2 diabetes mellitus without complications: Secondary | ICD-10-CM | POA: Diagnosis present

## 2019-05-22 DIAGNOSIS — Z7982 Long term (current) use of aspirin: Secondary | ICD-10-CM | POA: Diagnosis not present

## 2019-05-22 DIAGNOSIS — Z7902 Long term (current) use of antithrombotics/antiplatelets: Secondary | ICD-10-CM | POA: Diagnosis not present

## 2019-05-22 DIAGNOSIS — Z85810 Personal history of malignant neoplasm of tongue: Secondary | ICD-10-CM | POA: Diagnosis not present

## 2019-05-22 DIAGNOSIS — Z79890 Hormone replacement therapy (postmenopausal): Secondary | ICD-10-CM | POA: Diagnosis not present

## 2019-05-22 DIAGNOSIS — K219 Gastro-esophageal reflux disease without esophagitis: Secondary | ICD-10-CM | POA: Diagnosis not present

## 2019-05-22 DIAGNOSIS — I771 Stricture of artery: Secondary | ICD-10-CM

## 2019-05-22 DIAGNOSIS — Z8673 Personal history of transient ischemic attack (TIA), and cerebral infarction without residual deficits: Secondary | ICD-10-CM | POA: Diagnosis not present

## 2019-05-22 DIAGNOSIS — Z20828 Contact with and (suspected) exposure to other viral communicable diseases: Secondary | ICD-10-CM | POA: Diagnosis not present

## 2019-05-22 DIAGNOSIS — M109 Gout, unspecified: Secondary | ICD-10-CM | POA: Diagnosis present

## 2019-05-22 HISTORY — DX: Unspecified osteoarthritis, unspecified site: M19.90

## 2019-05-22 HISTORY — PX: IR INTRAVSC STENT CERV CAROTID W/EMB-PROT MOD SED INCL ANGIO: IMG2303

## 2019-05-22 HISTORY — DX: Gout, unspecified: M10.9

## 2019-05-22 HISTORY — PX: RADIOLOGY WITH ANESTHESIA: SHX6223

## 2019-05-22 LAB — CBC WITH DIFFERENTIAL/PLATELET
Abs Immature Granulocytes: 0.01 10*3/uL (ref 0.00–0.07)
Basophils Absolute: 0 10*3/uL (ref 0.0–0.1)
Basophils Relative: 0 %
Eosinophils Absolute: 0.2 10*3/uL (ref 0.0–0.5)
Eosinophils Relative: 4 %
HCT: 41.4 % (ref 39.0–52.0)
Hemoglobin: 13.9 g/dL (ref 13.0–17.0)
Immature Granulocytes: 0 %
Lymphocytes Relative: 19 %
Lymphs Abs: 0.9 10*3/uL (ref 0.7–4.0)
MCH: 29.9 pg (ref 26.0–34.0)
MCHC: 33.6 g/dL (ref 30.0–36.0)
MCV: 89 fL (ref 80.0–100.0)
Monocytes Absolute: 0.4 10*3/uL (ref 0.1–1.0)
Monocytes Relative: 8 %
Neutro Abs: 3.3 10*3/uL (ref 1.7–7.7)
Neutrophils Relative %: 69 %
Platelets: 185 10*3/uL (ref 150–400)
RBC: 4.65 MIL/uL (ref 4.22–5.81)
RDW: 12.6 % (ref 11.5–15.5)
WBC: 4.8 10*3/uL (ref 4.0–10.5)
nRBC: 0 % (ref 0.0–0.2)

## 2019-05-22 LAB — URINALYSIS, COMPLETE (UACMP) WITH MICROSCOPIC
Bacteria, UA: NONE SEEN
Bilirubin Urine: NEGATIVE
Glucose, UA: NEGATIVE mg/dL
Hgb urine dipstick: NEGATIVE
Ketones, ur: NEGATIVE mg/dL
Leukocytes,Ua: NEGATIVE
Nitrite: NEGATIVE
Protein, ur: NEGATIVE mg/dL
Specific Gravity, Urine: 1.019 (ref 1.005–1.030)
pH: 5 (ref 5.0–8.0)

## 2019-05-22 LAB — GLUCOSE, CAPILLARY
Glucose-Capillary: 143 mg/dL — ABNORMAL HIGH (ref 70–99)
Glucose-Capillary: 117 mg/dL — ABNORMAL HIGH (ref 70–99)

## 2019-05-22 LAB — BASIC METABOLIC PANEL
Anion gap: 10 (ref 5–15)
BUN: 21 mg/dL (ref 8–23)
CO2: 22 mmol/L (ref 22–32)
Calcium: 9.4 mg/dL (ref 8.9–10.3)
Chloride: 107 mmol/L (ref 98–111)
Creatinine, Ser: 1.1 mg/dL (ref 0.61–1.24)
GFR calc Af Amer: >60 mL/min (ref >60)
GFR calc non Af Amer: >60 mL/min (ref >60)
Glucose, Bld: 146 mg/dL — ABNORMAL HIGH (ref 70–99)
Potassium: 3.8 mmol/L (ref 3.5–5.1)
Sodium: 139 mmol/L (ref 135–145)

## 2019-05-22 LAB — PLATELET INHIBITION P2Y12: Platelet Function  P2Y12: 49 [PRU] — ABNORMAL LOW (ref 182–335)

## 2019-05-22 LAB — PROTIME-INR
INR: 1 (ref 0.8–1.2)
Prothrombin Time: 13 seconds (ref 11.4–15.2)

## 2019-05-22 LAB — POCT ACTIVATED CLOTTING TIME
Activated Clotting Time: 147 seconds
Activated Clotting Time: 164 seconds

## 2019-05-22 LAB — MRSA PCR SCREENING: MRSA by PCR: NEGATIVE

## 2019-05-22 SURGERY — RADIOLOGY WITH ANESTHESIA
Anesthesia: Monitor Anesthesia Care

## 2019-05-22 MED ORDER — ACETAMINOPHEN 160 MG/5ML PO SOLN: 650.0000 mg | ORAL | Status: DC | PRN

## 2019-05-22 MED ORDER — ASPIRIN 81 MG PO CHEW: 81.0000 mg | CHEWABLE_TABLET | Freq: Every day | ORAL | Status: DC

## 2019-05-22 MED ORDER — FENTANYL CITRATE (PF) 100 MCG/2ML IJ SOLN
INTRAMUSCULAR | Status: AC
  Filled 2019-05-22: qty 2

## 2019-05-22 MED ORDER — HEPARIN (PORCINE) 25000 UT/250ML-% IV SOLN
500.0000 [IU]/h | INTRAVENOUS | Status: DC
  Administered 2019-05-22: 500 [IU]/h via INTRAVENOUS

## 2019-05-22 MED ORDER — LIDOCAINE HCL 1 % IJ SOLN
INTRAMUSCULAR | Status: AC | PRN
  Administered 2019-05-22: 15 mL

## 2019-05-22 MED ORDER — SODIUM CHLORIDE 0.9% FLUSH: 10.0000 mL | INTRAVENOUS | Status: DC | PRN

## 2019-05-22 MED ORDER — DEXMEDETOMIDINE HCL 200 MCG/2ML IV SOLN
INTRAVENOUS | Status: DC | PRN
  Administered 2019-05-22 (×2): 8 ug via INTRAVENOUS

## 2019-05-22 MED ORDER — EPTIFIBATIDE 20 MG/10ML IV SOLN
INTRAVENOUS | Status: AC
  Filled 2019-05-22: qty 10

## 2019-05-22 MED ORDER — ACETAMINOPHEN 325 MG PO TABS: 325.0000 mg | ORAL_TABLET | ORAL | Status: DC | PRN

## 2019-05-22 MED ORDER — ONDANSETRON HCL 4 MG/2ML IJ SOLN: 4.0000 mg | Freq: Once | INTRAMUSCULAR | Status: DC | PRN

## 2019-05-22 MED ORDER — IOHEXOL 300 MG/ML  SOLN
150.0000 mL | Freq: Once | INTRAMUSCULAR | Status: AC | PRN
  Administered 2019-05-22: 90 mL via INTRA_ARTERIAL

## 2019-05-22 MED ORDER — FENTANYL CITRATE (PF) 100 MCG/2ML IJ SOLN
INTRAMUSCULAR | Status: DC | PRN
  Administered 2019-05-22 (×2): 50 ug via INTRAVENOUS

## 2019-05-22 MED ORDER — OXYCODONE HCL 5 MG/5ML PO SOLN: 5.0000 mg | Freq: Once | ORAL | Status: DC | PRN

## 2019-05-22 MED ORDER — SODIUM CHLORIDE 0.9 % IV SOLN
INTRAVENOUS | Status: DC
  Administered 2019-05-22: 08:00:00 via INTRAVENOUS

## 2019-05-22 MED ORDER — EPHEDRINE SULFATE-NACL 50-0.9 MG/10ML-% IV SOSY
PREFILLED_SYRINGE | INTRAVENOUS | Status: DC | PRN
  Administered 2019-05-22: 5 mg via INTRAVENOUS

## 2019-05-22 MED ORDER — CEFAZOLIN SODIUM-DEXTROSE 2-3 GM-%(50ML) IV SOLR
INTRAVENOUS | Status: DC | PRN
  Administered 2019-05-22: 2 g via INTRAVENOUS

## 2019-05-22 MED ORDER — TICAGRELOR 90 MG PO TABS
90.0000 mg | ORAL_TABLET | Freq: Two times a day (BID) | ORAL | Status: DC
  Administered 2019-05-22 – 2019-05-23 (×2): 90 mg via ORAL
  Filled 2019-05-22 (×3): qty 1

## 2019-05-22 MED ORDER — CEFAZOLIN SODIUM-DEXTROSE 2-4 GM/100ML-% IV SOLN
INTRAVENOUS | Status: AC
  Filled 2019-05-22: qty 100

## 2019-05-22 MED ORDER — NIMODIPINE 30 MG PO CAPS: 0.0000 mg | ORAL_CAPSULE | ORAL | Status: DC

## 2019-05-22 MED ORDER — CLEVIDIPINE BUTYRATE 0.5 MG/ML IV EMUL: 0.0000 mg/h | INTRAVENOUS | Status: DC

## 2019-05-22 MED ORDER — HEPARIN (PORCINE) 25000 UT/250ML-% IV SOLN
900.0000 [IU]/h | INTRAVENOUS | Status: DC
  Filled 2019-05-22: qty 250

## 2019-05-22 MED ORDER — NITROGLYCERIN 1 MG/10 ML FOR IR/CATH LAB
INTRA_ARTERIAL | Status: AC
  Filled 2019-05-22: qty 10

## 2019-05-22 MED ORDER — SODIUM CHLORIDE 0.9 % IV SOLN
INTRAVENOUS | Status: DC
  Administered 2019-05-22: 18:00:00 via INTRAVENOUS

## 2019-05-22 MED ORDER — ASPIRIN 81 MG PO CHEW
81.0000 mg | CHEWABLE_TABLET | Freq: Every day | ORAL | Status: DC
  Administered 2019-05-23: 81 mg via ORAL
  Filled 2019-05-22: qty 1

## 2019-05-22 MED ORDER — CEFAZOLIN SODIUM-DEXTROSE 2-4 GM/100ML-% IV SOLN: 2.0000 g | INTRAVENOUS | Status: DC

## 2019-05-22 MED ORDER — ACETAMINOPHEN 160 MG/5ML PO SOLN: 325.0000 mg | ORAL | Status: DC | PRN

## 2019-05-22 MED ORDER — ACETAMINOPHEN 650 MG RE SUPP: 650.0000 mg | RECTAL | Status: DC | PRN

## 2019-05-22 MED ORDER — FENTANYL CITRATE (PF) 100 MCG/2ML IJ SOLN: 25.0000 ug | INTRAMUSCULAR | Status: DC | PRN

## 2019-05-22 MED ORDER — MEPERIDINE HCL 25 MG/ML IJ SOLN: 6.2500 mg | INTRAMUSCULAR | Status: DC | PRN

## 2019-05-22 MED ORDER — HEPARIN (PORCINE) 25000 UT/250ML-% IV SOLN
INTRAVENOUS | Status: AC
  Filled 2019-05-22: qty 250

## 2019-05-22 MED ORDER — LIDOCAINE HCL 1 % IJ SOLN
INTRAMUSCULAR | Status: AC
  Filled 2019-05-22: qty 20

## 2019-05-22 MED ORDER — GLYCOPYRROLATE PF 0.2 MG/ML IJ SOSY
PREFILLED_SYRINGE | INTRAMUSCULAR | Status: DC | PRN
  Administered 2019-05-22 (×2): .2 mg via INTRAVENOUS

## 2019-05-22 MED ORDER — EPTIFIBATIDE 20 MG/10ML IV SOLN
INTRAVENOUS | Status: AC | PRN
  Administered 2019-05-22: 1.5 mg

## 2019-05-22 MED ORDER — ONDANSETRON HCL 4 MG/2ML IJ SOLN
INTRAMUSCULAR | Status: DC | PRN
  Administered 2019-05-22: 4 mg via INTRAVENOUS

## 2019-05-22 MED ORDER — ACETAMINOPHEN 325 MG PO TABS: 650.0000 mg | ORAL_TABLET | ORAL | Status: DC | PRN

## 2019-05-22 MED ORDER — CLOPIDOGREL BISULFATE 75 MG PO TABS: 75.0000 mg | ORAL_TABLET | ORAL | Status: DC

## 2019-05-22 MED ORDER — OXYCODONE HCL 5 MG PO TABS: 5.0000 mg | ORAL_TABLET | Freq: Once | ORAL | Status: DC | PRN

## 2019-05-22 MED ORDER — PANTOPRAZOLE SODIUM 40 MG PO TBEC
40.0000 mg | DELAYED_RELEASE_TABLET | Freq: Every day | ORAL | Status: DC
  Administered 2019-05-22 – 2019-05-23 (×2): 40 mg via ORAL
  Filled 2019-05-22 (×2): qty 1

## 2019-05-22 MED ORDER — HEPARIN SODIUM (PORCINE) 1000 UNIT/ML IJ SOLN
INTRAMUSCULAR | Status: DC | PRN
  Administered 2019-05-22: 500 [IU] via INTRAVENOUS
  Administered 2019-05-22: 1000 [IU] via INTRAVENOUS
  Administered 2019-05-22: 3000 [IU] via INTRAVENOUS

## 2019-05-22 MED ORDER — TICAGRELOR 90 MG PO TABS
90.0000 mg | ORAL_TABLET | Freq: Two times a day (BID) | ORAL | Status: DC
  Filled 2019-05-22: qty 1

## 2019-05-22 NOTE — Sedation Documentation (Signed)
Pt transferred to [facility].  Report given to [facility]  Right groin site assessed with Rosalita Chessman.  RLE pulses assessed with Rosalita Chessman.  Notified Surveyor, mining of Dr. Anette Guarneri orders for heparin infusion to be started and cleviprex order for BP control.  Pt care transferred to [facility]

## 2019-05-22 NOTE — Progress Notes (Signed)
Patient assessed alongside Dr. Estanislado Pandy this afternoon after neuro interventional procedure this a.m. Patient found to be in stable condition, recovering well in PACU. On heparin.  No focal deficits noted. Denies nausea, vomiting, headaches, pain. Groin site soft, without evidence of pseudoaneurysm or hematoma.  Patient to be admitted overnight.  Will be reassessed tomorrow morning for possible discharge.   Brynda Greathouse, MS RD PA-C   5:14 PM

## 2019-05-22 NOTE — Sedation Documentation (Signed)
ACT 147

## 2019-05-22 NOTE — H&P (Signed)
Chief Complaint: Patient was seen in consultation today for cerebral angiogram with possible left ICA stenosis intervention.  Supervising Physician: Luanne Bras  Patient Status: [facility] - Out-pt  History of Present Illness: Henry Bridges is a 68 y.o. male with a past medical history significant for arthritis, GERD, gout, tongue cancer, stroke, HTN, hypothyroidism and 70% left ICA stenosis who presents today for a cerebral angiogram with possible intervention. Patient underwent a diagnostic cerebral angiogram via right radial approach on 05/12/19 with Dr. Estanislado Pandy which showed an approximately 70% stenosis of the left ICA proximally. It was recommended that patient proceed with endovascular revascularization and after thorough discussion of the indications, risks, benefits and alternatives patient agreed to proceed with endovascular revascularization for which he presents today. He was previously taking Plavix 75 mg QD and ASA 325 mg QD which was stopped on 7/20 and patient instructed to start Brilinta 90 mg BID and ASA 81 mg QD on 7/21 which he reports he has been compliant with.    Patient seen at bedside today with Dr. Estanislado Pandy - he denies any complaints. He confirms that he is taking Brilinta 90 mg BID and ASA 81 mg QD. He states understanding of procedure indications, risks, benefits and alternatives and wishes to proceed as planned.   Past Medical History:  Diagnosis Date   Arthritis    Diabetes mellitus without complication (HCC)    GERD (gastroesophageal reflux disease)    Gout    Hypertension    Hypothyroidism    PONV (postoperative nausea and vomiting)    Stroke (HCC)    TIA   Tongue cancer (HCC)     Past Surgical History:  Procedure Laterality Date   bone spur     CYST EXCISION     on tailbone   FOOT SURGERY     lump of veins that was removed   IR ANGIO INTRA EXTRACRAN SEL COM CAROTID INNOMINATE BILAT MOD SED  05/12/2019   IR ANGIO VERTEBRAL SEL  VERTEBRAL BILAT MOD SED  05/12/2019   IR ANGIOGRAM EXTREMITY LEFT  05/12/2019   IR US GUIDE VASC ACCESS RIGHT  05/12/2019   SHOULDER ARTHROSCOPY Bilateral    to clean out bone spurs   TONGUE SURGERY      Allergies: Aspirin  Medications: Prior to Admission medications   Medication Sig Start Date End Date Taking? Authorizing Provider  amLODipine (NORVASC) 10 MG tablet Take 10 mg by mouth daily.    [provider]  aspirin EC 81 MG tablet Take 81 mg by mouth daily.    [provider]  atorvastatin (LIPITOR) 20 MG tablet Take 2 tablets (40 mg total) by mouth daily at 6 PM. Patient not taking: Reported on 05/12/2019 05/09/19 08/07/19  Barb Merino, MD  atorvastatin (LIPITOR) 40 MG tablet Take 40 mg by mouth daily at 6 PM.    [provider]  doxazosin (CARDURA) 2 MG tablet Take 2 mg by mouth daily.    [provider]  hydrochlorothiazide (HYDRODIURIL) 25 MG tablet Take 25 mg by mouth daily.    [provider]  lansoprazole (PREVACID) 30 MG capsule Take 30 mg by mouth daily at 12 noon.    [provider]  levothyroxine (SYNTHROID) 125 MCG tablet Take 125 mcg by mouth daily before breakfast.    [provider]  losartan (COZAAR) 100 MG tablet Take 100 mg by mouth daily.    [provider]  metFORMIN (GLUCOPHAGE) 1000 MG tablet Take 1,000 mg by mouth  2 (two) times daily with a meal.    [provider]  Omega-3 Fatty Acids (FISH OIL) 1000 MG CPDR Take 1,000-2,000 mg by mouth See admin instructions. Take 2 capsules in the morning, and 1 capsules in the evening    [provider]  ticagrelor (BRILINTA) 90 MG TABS tablet Take by mouth 2 (two) times daily.    [provider]     Family History  Problem Relation Age of Onset   Parkinson's disease Mother    Heart disease Father     Social History   Socioeconomic History   Marital status: Married    Spouse name: Not on file   Number of  children: Not on file   Years of education: Not on file   Highest education level: Not on file  Occupational History   Not on file  Social Needs   Financial resource strain: Not on file   Food insecurity    Worry: Not on file    Inability: Not on file   Transportation needs    Medical: Not on file    Non-medical: Not on file  Tobacco Use   Smoking status: Never Smoker   Smokeless tobacco: Never Used  Substance and Sexual Activity   Alcohol use: Not Currently   Drug use: Never   Sexual activity: Not on file  Lifestyle   Physical activity    Days per week: Not on file    Minutes per session: Not on file   Stress: Not on file  Relationships   Social connections    Talks on phone: Not on file    Gets together: Not on file    Attends religious service: Not on file    Active member of club or organization: Not on file    Attends meetings of clubs or organizations: Not on file    Relationship status: Not on file  Other Topics Concern   Not on file  Social History Narrative   Not on file     Review of Systems: A 12 point ROS discussed and pertinent positives are indicated in the HPI above.  All other systems are negative.  Review of Systems  Constitutional: Negative for chills and fever.  HENT: Negative for congestion and sore throat.   Respiratory: Negative for cough and shortness of breath.   Cardiovascular: Negative for chest pain and leg swelling.  Gastrointestinal: Negative for abdominal pain, blood in stool, diarrhea, nausea and vomiting.  Genitourinary: Negative for difficulty urinating, dysuria, flank pain and hematuria.  Musculoskeletal: Negative for back pain.  Skin: Negative for rash and wound.  Neurological: Negative for dizziness, syncope and headaches.  Psychiatric/Behavioral: Negative for confusion.    Vital Signs: There were no vitals taken for this visit.  Physical Exam Vitals signs reviewed.  Constitutional:      General: He is  not in acute distress.    Appearance: Normal appearance.  HENT:     Head: Normocephalic.     Mouth/Throat:     Mouth: Mucous membranes are moist.     Pharynx: Oropharynx is clear. No oropharyngeal exudate or posterior oropharyngeal erythema.  Cardiovascular:     Rate and Rhythm: Normal rate and regular rhythm.  Pulmonary:     Effort: Pulmonary effort is normal.     Breath sounds: Normal breath sounds.  Abdominal:     General: Bowel sounds are normal. There is no distension.     Palpations: Abdomen is soft.     Tenderness: There  is no abdominal tenderness.  Skin:    General: Skin is warm and dry.  Neurological:     General: No focal deficit present.     Mental Status: He is alert and oriented to person, place, and time.  Psychiatric:        Mood and Affect: Mood normal.        Behavior: Behavior normal.        Thought Content: Thought content normal.        Judgment: Judgment normal.      MD Evaluation Airway: WNL Heart: WNL Abdomen: WNL Chest/ Lungs: WNL ASA  Classification: 3 Mallampati/Airway Score: One   Imaging: Ct Angio Head W Or Wo Contrast  Result Date: 05/08/2019 CLINICAL DATA:  TIA EXAM: CT ANGIOGRAPHY HEAD AND NECK TECHNIQUE: Multidetector CT imaging of the head and neck was performed using the standard protocol during bolus administration of intravenous contrast. Multiplanar CT image reconstructions and MIPs were obtained to evaluate the vascular anatomy. Carotid stenosis measurements (when applicable) are obtained utilizing NASCET criteria, using the distal internal carotid diameter as the denominator. CONTRAST:  136mL OMNIPAQUE IOHEXOL 350 MG/ML SOLN COMPARISON:  MRI head 05/08/2019, CT head 05/07/2019 FINDINGS: CTA NECK FINDINGS Aortic arch: 4 vessel arch. Left vertebral artery origin from the arch. No significant atherosclerotic disease dissection or aneurysm in the aortic arch. Proximal great vessels widely patent. Right carotid system: Right carotid widely  patent. Minimal atherosclerotic disease right bifurcation Left carotid system: Left common carotid artery widely patent. Atherosclerotic disease in the left carotid bulb and proximal left internal carotid artery. 50% diameter stenosis of the left internal carotid artery above the bulb. Vertebral arteries: Left vertebral non dominant with origin from the arch. Both vertebral arteries are patent the basilar without significant stenosis. Skeleton: No acute skeletal abnormality. Other neck: Negative for mass or adenopathy Upper chest: Lung apices clear bilaterally. Review of the MIP images confirms the above findings CTA HEAD FINDINGS Anterior circulation: Cavernous carotid widely patent bilaterally without stenosis. Anterior and middle cerebral arteries widely patent bilaterally. Posterior circulation: Both vertebral arteries patent to the basilar. PICA patent bilaterally. Basilar widely patent. Superior cerebellar and posterior cerebral arteries and anterior inferior cerebellar arteries patent bilaterally. Fetal origin right posterior cerebral artery. Venous sinuses: Patent Negative for aneurysm. Review of the MIP images confirms the above findings IMPRESSION: 1. 50% diameter stenosis proximal left internal carotid artery 2. Right carotid artery widely patent. Both vertebral arteries widely patent. 3. Negative for intracranial large vessel occlusion or significant stenosis. Electronically Signed   By: Franchot Gallo M.D.   On: 05/08/2019 19:17   Ct Angio Neck W Or Wo Contrast  Result Date: 05/08/2019 CLINICAL DATA:  TIA EXAM: CT ANGIOGRAPHY HEAD AND NECK TECHNIQUE: Multidetector CT imaging of the head and neck was performed using the standard protocol during bolus administration of intravenous contrast. Multiplanar CT image reconstructions and MIPs were obtained to evaluate the vascular anatomy. Carotid stenosis measurements (when applicable) are obtained utilizing NASCET criteria, using the distal internal  carotid diameter as the denominator. CONTRAST:  149mL OMNIPAQUE IOHEXOL 350 MG/ML SOLN COMPARISON:  MRI head 05/08/2019, CT head 05/07/2019 FINDINGS: CTA NECK FINDINGS Aortic arch: 4 vessel arch. Left vertebral artery origin from the arch. No significant atherosclerotic disease dissection or aneurysm in the aortic arch. Proximal great vessels widely patent. Right carotid system: Right carotid widely patent. Minimal atherosclerotic disease right bifurcation Left carotid system: Left common carotid artery widely patent. Atherosclerotic disease in the left carotid bulb and proximal  left internal carotid artery. 50% diameter stenosis of the left internal carotid artery above the bulb. Vertebral arteries: Left vertebral non dominant with origin from the arch. Both vertebral arteries are patent the basilar without significant stenosis. Skeleton: No acute skeletal abnormality. Other neck: Negative for mass or adenopathy Upper chest: Lung apices clear bilaterally. Review of the MIP images confirms the above findings CTA HEAD FINDINGS Anterior circulation: Cavernous carotid widely patent bilaterally without stenosis. Anterior and middle cerebral arteries widely patent bilaterally. Posterior circulation: Both vertebral arteries patent to the basilar. PICA patent bilaterally. Basilar widely patent. Superior cerebellar and posterior cerebral arteries and anterior inferior cerebellar arteries patent bilaterally. Fetal origin right posterior cerebral artery. Venous sinuses: Patent Negative for aneurysm. Review of the MIP images confirms the above findings IMPRESSION: 1. 50% diameter stenosis proximal left internal carotid artery 2. Right carotid artery widely patent. Both vertebral arteries widely patent. 3. Negative for intracranial large vessel occlusion or significant stenosis. Electronically Signed   By: Franchot Gallo M.D.   On: 05/08/2019 19:17   [name] Contrast  Result Date: 05/08/2019 CLINICAL DATA:  68 y/o M;  headache, blurred vision, right arm weakness. TIA, initial exam. History of tongue cancer post resection and radiation. EXAM: MRI HEAD WITHOUT CONTRAST TECHNIQUE: Multiplanar, multiecho pulse sequences of the brain and surrounding structures were obtained without intravenous contrast. COMPARISON:  05/07/2019 CT head. FINDINGS: Brain: Mildly increased diffusion signal and mildly decreased ADC of the left caudate nucleus head and body on the axial diffusion sequence (series 8, image 30) and to lesser degree on the coronal diffusion sequence (series 1210, image 22). No associated hemorrhage or mass effect. On the axial DWI sequence there is also increased signal within the left anterior cingulate gyrus, right insula, and right hippocampus, however, this is not replicated on the coronal sequences and may be artifactual. No additional corresponding signal abnormality on other sequences. No acute hemorrhage, hydrocephalus, extra-axial collection, or mass effect. Scattered punctate nonspecific T2 FLAIR hyperintensities in subcortical and periventricular white matter are compatible with mild chronic microvascular ischemic changes. Mild volume loss of the brain. Vascular: Normal flow voids. Skull and upper cervical spine: Normal marrow signal. Sinuses/Orbits: Negative. Other: None. IMPRESSION: Mildly decreased diffusion of the left caudate nucleus. Findings may represent acute/early subacute infarction, however, the distribution is somewhat atypical for lacunar infarction raising the question of seizure related activity or metabolic/toxic injury. Electronically Signed   By: Kristine Garbe M.D.   On: 05/08/2019 00:24   Mr Jeri Cos BM Contrast  Result Date: 05/08/2019 CLINICAL DATA:  TIA.  Hand weakness.  History of tongue cancer EXAM: MRI HEAD WITHOUT AND WITH CONTRAST TECHNIQUE: Multiplanar, multiecho pulse sequences of the brain and surrounding structures were obtained without and with intravenous contrast.  CONTRAST:  10 mL Gadovist IV COMPARISON:  MRI head 05/07/2019 FINDINGS: Brain: Negative for acute infarct. Previously noted signal in the left caudate on axial diffusion imaging no longer present and likely was artifact. Different machines utilized on the 2 studies. Ventricle size normal. Scattered small white matter hyperintensities bilaterally appear chronic. Negative for hemorrhage mass or fluid collection. No midline shift. Normal enhancement. No evidence of metastatic disease. No abnormal enhancement in the left caudate region. Vascular: Normal arterial flow voids Skull and upper cervical spine: Negative Sinuses/Orbits: Negative Other: None IMPRESSION: No acute abnormality Diffusion-weighted signal left caudate on yesterday's study appears to be artifactual. Mild chronic white matter changes likely due to microvascular ischemia. Electronically Signed   By: Franchot Gallo  M.D.   On: 05/08/2019 13:16   Ir Angiogram Extremity Left  Result Date: 05/13/2019 CLINICAL DATA:  History of left sided amaurosis fugax, and of transient right-sided paresthesias. CT angiogram of the head and neck revealing significant stenosis of the proximal left internal carotid artery. EXAM: IR ANGIO VERTEBRAL SEL SUBCLAVIAN INNOMINATE UNI LEFT MOD SED; LEFT EXTREMITY ARTERIOGRAPHY; IR ULTRASOUND GUIDANCE VASC ACCESS RIGHT; BILATERAL COMMON CAROTID AND INNOMINATE ANGIOGRAPHY; IR ANGIO VERTEBRAL SEL VERTEBRAL UNI RIGHT MOD SED COMPARISON:  CT angiogram of the head and neck of 05/08/2019. MEDICATIONS: Heparin 2000 units intra-arterially. No antibiotic was administered within 1 hour of the procedure. ANESTHESIA/SEDATION: Versed 1 mg IV; Fentanyl 25 mcg IV. Moderate Sedation Time:  35 minutes minutes. The patient was continuously monitored during the procedure by the interventional radiology nurse under my direct supervision. CONTRAST:  Isovue 300 approximately 70 mm. FLUOROSCOPY TIME:  Fluoroscopy Time: 18 minutes 36 seconds (867 mGy).  COMPLICATIONS: None immediate. TECHNIQUE: Informed written consent was obtained from the patient after a thorough discussion of the procedural risks, benefits and alternatives. All questions were addressed. Maximal Sterile Barrier Technique was utilized including caps, mask, sterile gowns, sterile gloves, sterile drape, hand hygiene and skin antiseptic. A timeout was performed prior to the initiation of the procedure. The right forearm to the wrist was prepped and draped in the usual sterile fashion. The right radial artery was then identified, and a dorsal palmar anastomosis verified. The right radial artery morphology was then evaluated and documented with ultrasound imaging. Using ultrasound guidance transradial access into the right radial artery was then obtained with a micropuncture needle. Over a 0.018 inch micro guidewire, a 4/5 French radial sheath was then advanced into the right radial artery without difficulty. Good aspiration was obtained from the side port of the radial sheath. A cocktail of 2000 units of heparin, 2.5 mg of verapamil, and 200 mcg of nitroglycerin were then injected in a diluted form without difficulty. A right radial angiogram was obtained. Over a 0.035 inch Roadrunner guidewire, a 5 [place] Simmons 2 diagnostic catheter was then advanced to the aortic arch region, and formed without difficulty. Selective cannulations were then performed with the right common carotid artery, the left common carotid artery, the left subclavian artery, the left vertebral artery, and the right vertebral artery. Following the procedure, the diagnostic catheter was removed. The right radial sheath was then removed with the application of the wrist band for hemostasis. The distal right radial pulse was then verified at the end of the procedure. FINDINGS: The right common carotid arteriogram demonstrates the right external carotid artery and its major branches to be widely patent. The right internal carotid  artery at the bulb to the cranial skull base demonstrates wide patency. The petrous, cavernous and supraclinoid segments are widely patent. Right posterior communicating artery is seen opacifying the right posterior cerebral distribution. The right middle cerebral artery and the right anterior cerebral artery opacify into the capillary and venous phases. The left common carotid arteriogram demonstrates the left external carotid artery and its major branches to be widely patent. The left internal carotid artery just distal to the bulb has a severe tapered narrowing of approximately 70% by the NASCET criteria. Distal to this the vessel then resumes normal caliber in its distal cervical segment. The petrous, cavernous and the supraclinoid segments are widely patent. The left middle cerebral artery and the left anterior cerebral artery opacify into the capillary and venous phases. A small infundibulum is noted at the origin of  the left posterior-inferior cerebellar artery. The left middle cerebral artery and the left anterior cerebral artery opacify into the capillary and venous phases. The left subclavian arteriogram demonstrates ascending cervical branch of the left thyrocervical trunk. This ascends to the level of C2. The left vertebral artery origin is between the origins of the left subclavian artery, and the left common carotid artery. The vessel is seen to meander to the cranial skull base. Opacification is seen of the left vertebrobasilar junction and the left posterior-inferior cerebellar artery. On the lateral projection, the opacified portion of the basilar artery, the superior cerebellar arteries, the posterior cerebral artery on the left side, and of the anterior-inferior cerebellar arteries is grossly intact into the delayed arterial phase. Unopacified blood is seen in the basilar artery from the contralateral vertebral artery. The dominant right vertebral artery origin is widely patent. The vessel is seen  to opacify to the cranial skull base. Wide patency is seen of the right posterior-inferior cerebellar artery and the right vertebrobasilar junction. The basilar artery, the left posterior cerebral artery, the superior cerebellar arteries and anterior inferior cerebellar arteries opacify into the capillary and venous phases. Nonvisualization of the right posterior cerebral artery is related to dominant right PCOM opacifying from the right internal carotid artery. 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 PLAN: Findings were reviewed with the patient. They understand the symptomatic nature of the left internal carotid artery severe stenosis given his recent symptoms. In order to prevent further left cerebral hemispheric ischemic events related to this stenosis, endovascular revascularization with distal protection was discussed. The procedure was discussed with the patient. This would be with distal protection with a risk of 1-2% chance of an ischemic stroke. Very remote possibility of reperfusion hemorrhage was also reviewed. The patient expressed understanding and wanted to proceed with endovascular revascularization of the symptomatic stenosis of the left internal carotid artery. Electronically Signed   By: Luanne Bras M.D.   On: 05/12/2019 13:20   Ir US Guide Vasc Access Right  Result Date: 05/13/2019 CLINICAL DATA:  History of left sided amaurosis fugax, and of transient right-sided paresthesias. CT angiogram of the head and neck revealing significant stenosis of the proximal left internal carotid artery. EXAM: IR ANGIO VERTEBRAL SEL SUBCLAVIAN INNOMINATE UNI LEFT MOD SED; LEFT EXTREMITY ARTERIOGRAPHY; IR ULTRASOUND GUIDANCE VASC ACCESS RIGHT; BILATERAL COMMON CAROTID AND INNOMINATE ANGIOGRAPHY; IR ANGIO VERTEBRAL SEL VERTEBRAL UNI RIGHT MOD SED COMPARISON:  CT angiogram of the head and neck of 05/08/2019.  MEDICATIONS: Heparin 2000 units intra-arterially. No antibiotic was administered within 1 hour of the procedure. ANESTHESIA/SEDATION: Versed 1 mg IV; Fentanyl 25 mcg IV. Moderate Sedation Time:  35 minutes minutes. The patient was continuously monitored during the procedure by the interventional radiology nurse under my direct supervision. CONTRAST:  Isovue 300 approximately 70 mm. FLUOROSCOPY TIME:  Fluoroscopy Time: 18 minutes 36 seconds (867 mGy). COMPLICATIONS: None immediate. TECHNIQUE: Informed written consent was obtained from the patient after a thorough discussion of the procedural risks, benefits and alternatives. All questions were addressed. Maximal Sterile Barrier Technique was utilized including caps, mask, sterile gowns, sterile gloves, sterile drape, hand hygiene and skin antiseptic. A timeout was performed prior to the initiation of the procedure. The right forearm to the wrist was prepped and draped in the usual sterile fashion. The right radial artery was then identified, and a dorsal palmar anastomosis verified. The right radial artery morphology was then evaluated  and documented with ultrasound imaging. Using ultrasound guidance transradial access into the right radial artery was then obtained with a micropuncture needle. Over a 0.018 inch micro guidewire, a 4/5 French radial sheath was then advanced into the right radial artery without difficulty. Good aspiration was obtained from the side port of the radial sheath. A cocktail of 2000 units of heparin, 2.5 mg of verapamil, and 200 mcg of nitroglycerin were then injected in a diluted form without difficulty. A right radial angiogram was obtained. Over a 0.035 inch Roadrunner guidewire, a 5 [place] Simmons 2 diagnostic catheter was then advanced to the aortic arch region, and formed without difficulty. Selective cannulations were then performed with the right common carotid artery, the left common carotid artery, the left subclavian artery, the  left vertebral artery, and the right vertebral artery. Following the procedure, the diagnostic catheter was removed. The right radial sheath was then removed with the application of the wrist band for hemostasis. The distal right radial pulse was then verified at the end of the procedure. FINDINGS: The right common carotid arteriogram demonstrates the right external carotid artery and its major branches to be widely patent. The right internal carotid artery at the bulb to the cranial skull base demonstrates wide patency. The petrous, cavernous and supraclinoid segments are widely patent. Right posterior communicating artery is seen opacifying the right posterior cerebral distribution. The right middle cerebral artery and the right anterior cerebral artery opacify into the capillary and venous phases. The left common carotid arteriogram demonstrates the left external carotid artery and its major branches to be widely patent. The left internal carotid artery just distal to the bulb has a severe tapered narrowing of approximately 70% by the NASCET criteria. Distal to this the vessel then resumes normal caliber in its distal cervical segment. The petrous, cavernous and the supraclinoid segments are widely patent. The left middle cerebral artery and the left anterior cerebral artery opacify into the capillary and venous phases. A small infundibulum is noted at the origin of the left posterior-inferior cerebellar artery. The left middle cerebral artery and the left anterior cerebral artery opacify into the capillary and venous phases. The left subclavian arteriogram demonstrates ascending cervical branch of the left thyrocervical trunk. This ascends to the level of C2. The left vertebral artery origin is between the origins of the left subclavian artery, and the left common carotid artery. The vessel is seen to meander to the cranial skull base. Opacification is seen of the left vertebrobasilar junction and the left  posterior-inferior cerebellar artery. On the lateral projection, the opacified portion of the basilar artery, the superior cerebellar arteries, the posterior cerebral artery on the left side, and of the anterior-inferior cerebellar arteries is grossly intact into the delayed arterial phase. Unopacified blood is seen in the basilar artery from the contralateral vertebral artery. The dominant right vertebral artery origin is widely patent. The vessel is seen to opacify to the cranial skull base. Wide patency is seen of the right posterior-inferior cerebellar artery and the right vertebrobasilar junction. The basilar artery, the left posterior cerebral artery, the superior cerebellar arteries and anterior inferior cerebellar arteries opacify into the capillary and venous phases. Nonvisualization of the right posterior cerebral artery is related to dominant right PCOM opacifying from the right internal carotid artery. 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 PLAN: Findings were reviewed with the patient. They understand the symptomatic nature of the left internal  carotid artery severe stenosis given his recent symptoms. In order to prevent further left cerebral hemispheric ischemic events related to this stenosis, endovascular revascularization with distal protection was discussed. The procedure was discussed with the patient. This would be with distal protection with a risk of 1-2% chance of an ischemic stroke. Very remote possibility of reperfusion hemorrhage was also reviewed. The patient expressed understanding and wanted to proceed with endovascular revascularization of the symptomatic stenosis of the left internal carotid artery. Electronically Signed   By: Luanne Bras M.D.   On: 05/12/2019 13:20   Ct Head Code Stroke Wo Contrast  Result Date: 05/07/2019 CLINICAL DATA:  Code stroke. Headache above the left eye,  blurred vision, and right hand weakness lasting 15-30 minutes. EXAM: CT HEAD WITHOUT CONTRAST TECHNIQUE: Contiguous axial images were obtained from the base of the skull through the vertex without intravenous contrast. COMPARISON:  None. FINDINGS: Brain: There is no evidence of acute infarct, intracranial hemorrhage, mass, midline shift, or extra-axial fluid collection. The ventricles and sulci are within normal limits for age. Patchy hypodensities in the cerebral white matter bilaterally are nonspecific but compatible with mild chronic small vessel ischemic disease. Vascular: No hyperdense vessel. Skull: No fracture or suspicious osseous lesion. Sinuses/Orbits: Visualized paranasal sinuses and mastoid air cells are clear. Visualized orbits are unremarkable. Other: None. ASPECTS [facility] Stroke Program Early CT Score) - Ganglionic level infarction (caudate, lentiform nuclei, internal capsule, insula, M1-M3 cortex): 7 - Supraganglionic infarction (M4-M6 cortex): 3 Total score (0-10 with 10 being normal): 10 IMPRESSION: 1. No evidence of acute intracranial abnormality. 2. ASPECTS is 10. 3. Mild chronic small vessel ischemic disease. These results were called by telephone at the time of interpretation on 05/07/2019 at 1:00 pm to Dr. Aletta Edouard , who verbally acknowledged these results. Electronically Signed   By: Logan Bores M.D.   On: 05/07/2019 13:01   Vas US Carotid (at [place] Only)  Result Date: 05/10/2019 Carotid Arterial Duplex Study Indications:       TIA. Risk Factors:      Hypertension. Comparison Study:  no prior Performing Technologist: Abram Sander RVS  Examination Guidelines: A complete evaluation includes B-mode imaging, spectral Doppler, color Doppler, and power Doppler as needed of all accessible portions of each vessel. Bilateral testing is considered an integral part of a complete examination. Limited examinations for reoccurring indications may be performed as noted.  Right Carotid  Findings: +----------+--------+--------+--------+------------+--------+             PSV cm/s EDV cm/s Stenosis Describe     Comments  +----------+--------+--------+--------+------------+--------+  CCA Prox   85       16                heterogenous           +----------+--------+--------+--------+------------+--------+  CCA Distal 75       23                heterogenous           +----------+--------+--------+--------+------------+--------+  ICA Prox   81       27       1-39%    heterogenous           +----------+--------+--------+--------+------------+--------+  ICA Distal 63       22                                       +----------+--------+--------+--------+------------+--------+  ECA        72       7                                        +----------+--------+--------+--------+------------+--------+ +----------+--------+-------+--------+-------------------+             PSV cm/s EDV cms Describe Arm Pressure (mmHG)  +----------+--------+-------+--------+-------------------+  Subclavian 100                                            +----------+--------+-------+--------+-------------------+ +---------+--------+--+--------+--+---------+  Vertebral PSV cm/s 37 EDV cm/s 10 Antegrade  +---------+--------+--+--------+--+---------+  Left Carotid Findings: +----------+--------+--------+--------+------------+--------+             PSV cm/s EDV cm/s Stenosis Describe     Comments  +----------+--------+--------+--------+------------+--------+  CCA Prox   73       16                                       +----------+--------+--------+--------+------------+--------+  CCA Distal 67       16                heterogenous           +----------+--------+--------+--------+------------+--------+  ICA Prox   237      83       60-79%   heterogenous           +----------+--------+--------+--------+------------+--------+  ICA Mid    166      61                                        +----------+--------+--------+--------+------------+--------+  ICA Distal 107      23                                       +----------+--------+--------+--------+------------+--------+  ECA        96       14                                       +----------+--------+--------+--------+------------+--------+ +----------+--------+--------+--------+-------------------+  Subclavian PSV cm/s EDV cm/s Describe Arm Pressure (mmHG)  +----------+--------+--------+--------+-------------------+             104                                             +----------+--------+--------+--------+-------------------+ +---------+--------+--+--------+-+---------+  Vertebral PSV cm/s 38 EDV cm/s 6 Antegrade  +---------+--------+--+--------+-+---------+  Summary: Right Carotid: Velocities in the right ICA are consistent with a 1-39% stenosis. Left Carotid: Velocities in the left ICA are consistent with a 60-79% stenosis. Vertebrals: Bilateral vertebral arteries demonstrate antegrade flow. *See table(s) above for measurements and observations.  Electronically signed by Antony Contras MD on 05/10/2019 at 11:38:51 AM.    Final    Ir Angio Intra Extracran Sel Com Carotid  Innominate Bilat Mod Sed  Result Date: 05/13/2019 CLINICAL DATA:  History of left sided amaurosis fugax, and of transient right-sided paresthesias. CT angiogram of the head and neck revealing significant stenosis of the proximal left internal carotid artery. EXAM: IR ANGIO VERTEBRAL SEL SUBCLAVIAN INNOMINATE UNI LEFT MOD SED; LEFT EXTREMITY ARTERIOGRAPHY; IR ULTRASOUND GUIDANCE VASC ACCESS RIGHT; BILATERAL COMMON CAROTID AND INNOMINATE ANGIOGRAPHY; IR ANGIO VERTEBRAL SEL VERTEBRAL UNI RIGHT MOD SED COMPARISON:  CT angiogram of the head and neck of 05/08/2019. MEDICATIONS: Heparin 2000 units intra-arterially. No antibiotic was administered within 1 hour of the procedure. ANESTHESIA/SEDATION: Versed 1 mg IV; Fentanyl 25 mcg IV. Moderate Sedation Time:  35 minutes minutes.  The patient was continuously monitored during the procedure by the interventional radiology nurse under my direct supervision. CONTRAST:  Isovue 300 approximately 70 mm. FLUOROSCOPY TIME:  Fluoroscopy Time: 18 minutes 36 seconds (867 mGy). COMPLICATIONS: None immediate. TECHNIQUE: Informed written consent was obtained from the patient after a thorough discussion of the procedural risks, benefits and alternatives. All questions were addressed. Maximal Sterile Barrier Technique was utilized including caps, mask, sterile gowns, sterile gloves, sterile drape, hand hygiene and skin antiseptic. A timeout was performed prior to the initiation of the procedure. The right forearm to the wrist was prepped and draped in the usual sterile fashion. The right radial artery was then identified, and a dorsal palmar anastomosis verified. The right radial artery morphology was then evaluated and documented with ultrasound imaging. Using ultrasound guidance transradial access into the right radial artery was then obtained with a micropuncture needle. Over a 0.018 inch micro guidewire, a 4/5 French radial sheath was then advanced into the right radial artery without difficulty. Good aspiration was obtained from the side port of the radial sheath. A cocktail of 2000 units of heparin, 2.5 mg of verapamil, and 200 mcg of nitroglycerin were then injected in a diluted form without difficulty. A right radial angiogram was obtained. Over a 0.035 inch Roadrunner guidewire, a 5 [place] Simmons 2 diagnostic catheter was then advanced to the aortic arch region, and formed without difficulty. Selective cannulations were then performed with the right common carotid artery, the left common carotid artery, the left subclavian artery, the left vertebral artery, and the right vertebral artery. Following the procedure, the diagnostic catheter was removed. The right radial sheath was then removed with the application of the wrist band for hemostasis. The  distal right radial pulse was then verified at the end of the procedure. FINDINGS: The right common carotid arteriogram demonstrates the right external carotid artery and its major branches to be widely patent. The right internal carotid artery at the bulb to the cranial skull base demonstrates wide patency. The petrous, cavernous and supraclinoid segments are widely patent. Right posterior communicating artery is seen opacifying the right posterior cerebral distribution. The right middle cerebral artery and the right anterior cerebral artery opacify into the capillary and venous phases. The left common carotid arteriogram demonstrates the left external carotid artery and its major branches to be widely patent. The left internal carotid artery just distal to the bulb has a severe tapered narrowing of approximately 70% by the NASCET criteria. Distal to this the vessel then resumes normal caliber in its distal cervical segment. The petrous, cavernous and the supraclinoid segments are widely patent. The left middle cerebral artery and the left anterior cerebral artery opacify into the capillary and venous phases. A small infundibulum is noted at the origin of the left posterior-inferior cerebellar artery. The left middle  cerebral artery and the left anterior cerebral artery opacify into the capillary and venous phases. The left subclavian arteriogram demonstrates ascending cervical branch of the left thyrocervical trunk. This ascends to the level of C2. The left vertebral artery origin is between the origins of the left subclavian artery, and the left common carotid artery. The vessel is seen to meander to the cranial skull base. Opacification is seen of the left vertebrobasilar junction and the left posterior-inferior cerebellar artery. On the lateral projection, the opacified portion of the basilar artery, the superior cerebellar arteries, the posterior cerebral artery on the left side, and of the anterior-inferior  cerebellar arteries is grossly intact into the delayed arterial phase. Unopacified blood is seen in the basilar artery from the contralateral vertebral artery. The dominant right vertebral artery origin is widely patent. The vessel is seen to opacify to the cranial skull base. Wide patency is seen of the right posterior-inferior cerebellar artery and the right vertebrobasilar junction. The basilar artery, the left posterior cerebral artery, the superior cerebellar arteries and anterior inferior cerebellar arteries opacify into the capillary and venous phases. Nonvisualization of the right posterior cerebral artery is related to dominant right PCOM opacifying from the right internal carotid artery. 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 PLAN: Findings were reviewed with the patient. They understand the symptomatic nature of the left internal carotid artery severe stenosis given his recent symptoms. In order to prevent further left cerebral hemispheric ischemic events related to this stenosis, endovascular revascularization with distal protection was discussed. The procedure was discussed with the patient. This would be with distal protection with a risk of 1-2% chance of an ischemic stroke. Very remote possibility of reperfusion hemorrhage was also reviewed. The patient expressed understanding and wanted to proceed with endovascular revascularization of the symptomatic stenosis of the left internal carotid artery. Electronically Signed   By: Luanne Bras M.D.   On: 05/12/2019 13:20   Ir Angio Vertebral Sel Vertebral Bilat Mod Sed  Result Date: 05/13/2019 CLINICAL DATA:  History of left sided amaurosis fugax, and of transient right-sided paresthesias. CT angiogram of the head and neck revealing significant stenosis of the proximal left internal carotid artery. EXAM: IR ANGIO VERTEBRAL SEL SUBCLAVIAN INNOMINATE UNI  LEFT MOD SED; LEFT EXTREMITY ARTERIOGRAPHY; IR ULTRASOUND GUIDANCE VASC ACCESS RIGHT; BILATERAL COMMON CAROTID AND INNOMINATE ANGIOGRAPHY; IR ANGIO VERTEBRAL SEL VERTEBRAL UNI RIGHT MOD SED COMPARISON:  CT angiogram of the head and neck of 05/08/2019. MEDICATIONS: Heparin 2000 units intra-arterially. No antibiotic was administered within 1 hour of the procedure. ANESTHESIA/SEDATION: Versed 1 mg IV; Fentanyl 25 mcg IV. Moderate Sedation Time:  35 minutes minutes. The patient was continuously monitored during the procedure by the interventional radiology nurse under my direct supervision. CONTRAST:  Isovue 300 approximately 70 mm. FLUOROSCOPY TIME:  Fluoroscopy Time: 18 minutes 36 seconds (867 mGy). COMPLICATIONS: None immediate. TECHNIQUE: Informed written consent was obtained from the patient after a thorough discussion of the procedural risks, benefits and alternatives. All questions were addressed. Maximal Sterile Barrier Technique was utilized including caps, mask, sterile gowns, sterile gloves, sterile drape, hand hygiene and skin antiseptic. A timeout was performed prior to the initiation of the procedure. The right forearm to the wrist was prepped and draped in the usual sterile fashion. The right radial artery was then identified, and a dorsal palmar anastomosis verified. The right radial artery morphology was then evaluated and documented with ultrasound imaging. Using  ultrasound guidance transradial access into the right radial artery was then obtained with a micropuncture needle. Over a 0.018 inch micro guidewire, a 4/5 French radial sheath was then advanced into the right radial artery without difficulty. Good aspiration was obtained from the side port of the radial sheath. A cocktail of 2000 units of heparin, 2.5 mg of verapamil, and 200 mcg of nitroglycerin were then injected in a diluted form without difficulty. A right radial angiogram was obtained. Over a 0.035 inch Roadrunner guidewire, a 5 [place]  Simmons 2 diagnostic catheter was then advanced to the aortic arch region, and formed without difficulty. Selective cannulations were then performed with the right common carotid artery, the left common carotid artery, the left subclavian artery, the left vertebral artery, and the right vertebral artery. Following the procedure, the diagnostic catheter was removed. The right radial sheath was then removed with the application of the wrist band for hemostasis. The distal right radial pulse was then verified at the end of the procedure. FINDINGS: The right common carotid arteriogram demonstrates the right external carotid artery and its major branches to be widely patent. The right internal carotid artery at the bulb to the cranial skull base demonstrates wide patency. The petrous, cavernous and supraclinoid segments are widely patent. Right posterior communicating artery is seen opacifying the right posterior cerebral distribution. The right middle cerebral artery and the right anterior cerebral artery opacify into the capillary and venous phases. The left common carotid arteriogram demonstrates the left external carotid artery and its major branches to be widely patent. The left internal carotid artery just distal to the bulb has a severe tapered narrowing of approximately 70% by the NASCET criteria. Distal to this the vessel then resumes normal caliber in its distal cervical segment. The petrous, cavernous and the supraclinoid segments are widely patent. The left middle cerebral artery and the left anterior cerebral artery opacify into the capillary and venous phases. A small infundibulum is noted at the origin of the left posterior-inferior cerebellar artery. The left middle cerebral artery and the left anterior cerebral artery opacify into the capillary and venous phases. The left subclavian arteriogram demonstrates ascending cervical branch of the left thyrocervical trunk. This ascends to the level of C2. The  left vertebral artery origin is between the origins of the left subclavian artery, and the left common carotid artery. The vessel is seen to meander to the cranial skull base. Opacification is seen of the left vertebrobasilar junction and the left posterior-inferior cerebellar artery. On the lateral projection, the opacified portion of the basilar artery, the superior cerebellar arteries, the posterior cerebral artery on the left side, and of the anterior-inferior cerebellar arteries is grossly intact into the delayed arterial phase. Unopacified blood is seen in the basilar artery from the contralateral vertebral artery. The dominant right vertebral artery origin is widely patent. The vessel is seen to opacify to the cranial skull base. Wide patency is seen of the right posterior-inferior cerebellar artery and the right vertebrobasilar junction. The basilar artery, the left posterior cerebral artery, the superior cerebellar arteries and anterior inferior cerebellar arteries opacify into the capillary and venous phases. Nonvisualization of the right posterior cerebral artery is related to dominant right PCOM opacifying from the right internal carotid artery. 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 PLAN: Findings were reviewed with the patient. They understand the symptomatic nature of the left internal carotid artery severe stenosis given his  recent symptoms. In order to prevent further left cerebral hemispheric ischemic events related to this stenosis, endovascular revascularization with distal protection was discussed. The procedure was discussed with the patient. This would be with distal protection with a risk of 1-2% chance of an ischemic stroke. Very remote possibility of reperfusion hemorrhage was also reviewed. The patient expressed understanding and wanted to proceed with endovascular revascularization of the  symptomatic stenosis of the left internal carotid artery. Electronically Signed   By: Luanne Bras M.D.   On: 05/12/2019 13:20    Labs:  CBC: Recent Labs    05/07/19 1237 05/08/19 0414 05/12/19 0704 05/22/19 0657  WBC 6.1 5.5 5.1 4.8  HGB 13.8 13.1 13.4 13.9  HCT 41.7 39.2 40.6 41.4  PLT 193 182 194 185    COAGS: Recent Labs    05/07/19 1237 05/12/19 0704 05/22/19 0657  INR 0.9 1.0 1.0  APTT 25 28  --     BMP: Recent Labs    05/07/19 1237 05/12/19 0704 05/22/19 0657  NA 138 141 139  K 3.7 3.6 3.8  CL 103 108 107  CO2 23 25 22   GLUCOSE 171* 164* 146*  BUN 18 20 21   CALCIUM 9.4 9.2 9.4  CREATININE 1.08 1.23 1.10  GFRNONAA >60 >60 >60  GFRAA >60 >60 >60    LIVER FUNCTION TESTS: Recent Labs    05/07/19 1237  BILITOT 0.9  AST 23  ALT 24  ALKPHOS 73  PROT 7.6  ALBUMIN 4.3    TUMOR MARKERS: No results for input(s): AFPTM, CEA, CA199, CHROMGRNA in the last 8760 hours.  Assessment and Plan:  68 y/o M who underwent diagnostic cerebral angiogram on 7/20 with Dr. Estanislado Pandy which noted an approximately 70% left ICA stenosis. Following the procedure the results were discussed with the patient and after thorough discussion Mr. Mussa elected to proceed with cerebral angiogram with intervention for which he presents today.  Patient has been NPO since midnight, he currently take Brilinta 90 mg BID and ASA 81 mg QD. Afebrile, WBC 4.8, hgb 13.9, plt 185, INR 1.0, p2y12 49, creatinine 1.10, UA unremarkable.  Risks and benefits of cerebral angiogram with intervention were discussed with the patient including, but not limited to bleeding, infection, vascular injury, contrast induced renal failure, stroke or even death.  This interventional procedure involves the use of X-rays and because of the nature of the planned procedure, it is possible that we will have prolonged use of X-ray fluoroscopy. Potential radiation risks to you include (but are not limited to)  the following: - A slightly elevated risk for cancer  several years later in life. This risk is typically less than 0.5% percent. This risk is low in comparison to the normal incidence of human cancer, which is 33% for women and 50% for men according to the [employer]. - Radiation induced injury can include skin redness, resembling a rash, tissue breakdown / ulcers and hair loss (which can be temporary or permanent).  The likelihood of either of these occurring depends on the difficulty of the procedure and whether you are sensitive to radiation due to previous procedures, disease, or genetic conditions.  IF your procedure requires a prolonged use of radiation, you will be notified and given written instructions for further action.  It is your responsibility to monitor the irradiated area for the 2 weeks following the procedure and to notify your physician if you are concerned that you have suffered a radiation induced injury.  All of the patient's questions were answered, patient is agreeable to proceed.  Consent signed and in chart.  Thank you for this interesting consult.  I greatly enjoyed meeting Henry Bridges and look forward to participating in their care.  A copy of this report was sent to the requesting provider on this date.  Electronically Signed: Joaquim Nam, PA-C 05/22/2019, 9:37 AM   I spent a total of  25 Minutes in face to face in clinical consultation, greater than 50% of which was counseling/coordinating care for cerebral angiogram with intervention.

## 2019-05-22 NOTE — Progress Notes (Signed)
ANTICOAGULATION CONSULT NOTE - Initial Consult  Pharmacy Consult for heparin  Indication: Post Interventional Neuroradiology Procedure  Allergies  Allergen Reactions  . Aspirin Other (See Comments)    Made his heartburn worse    Patient Measurements: Height: 5\' 10"  (177.8 cm) Weight: 230 lb (104.3 kg) IBW/kg (Calculated) : 73 Heparin Dosing Weight: 94kg  Vital Signs: Temp: 97.6 F (36.4 C) (07/30 1540) Temp Source: Oral (07/30 0630) BP: 99/63 (07/30 1610) Pulse Rate: 44 (07/30 1610)  Labs: Recent Labs    05/22/19 0657  HGB 13.9  HCT 41.4  PLT 185  LABPROT 13.0  INR 1.0  CREATININE 1.10    Estimated Creatinine Clearance: 79.9 mL/min (by C-G formula based on SCr of 1.1 mg/dL).   Medical History: Past Medical History:  Diagnosis Date  . Arthritis   . Diabetes mellitus without complication ([place])   . GERD (gastroesophageal reflux disease)   . Gout   . Hypertension   . Hypothyroidism   . PONV (postoperative nausea and vomiting)   . Stroke [facility])    TIA  . Tongue cancer ([place])     Medications:  Medications Prior to Admission  Medication Sig Dispense Refill Last Dose  . amLODipine (NORVASC) 10 MG tablet Take 10 mg by mouth daily.   05/22/2019 at [place]  . aspirin EC 81 MG tablet Take 81 mg by mouth daily.   05/22/2019 at [place]  . atorvastatin (LIPITOR) 40 MG tablet Take 40 mg by mouth daily at 6 PM.   05/21/2019 at Unknown time  . doxazosin (CARDURA) 2 MG tablet Take 2 mg by mouth daily.   05/22/2019 at 0515  . hydrochlorothiazide (HYDRODIURIL) 25 MG tablet Take 25 mg by mouth daily.   05/21/2019 at Unknown time  . lansoprazole (PREVACID) 30 MG capsule Take 30 mg by mouth daily at 12 noon.   05/21/2019 at Unknown time  . levothyroxine (SYNTHROID) 125 MCG tablet Take 125 mcg by mouth daily before breakfast.   05/22/2019 at 0515  . losartan (COZAAR) 100 MG tablet Take 100 mg by mouth daily.   05/21/2019 at Unknown time  . metFORMIN (GLUCOPHAGE) 1000 MG tablet Take 1,000 mg  by mouth 2 (two) times daily with a meal.   05/21/2019 at Unknown time  . Omega-3 Fatty Acids (FISH OIL) 1000 MG CPDR Take 1,000-2,000 mg by mouth See admin instructions. Take 2 capsules in the morning, and 1 capsules in the evening   05/21/2019 at Unknown time  . ticagrelor (BRILINTA) 90 MG TABS tablet Take by mouth 2 (two) times daily.   05/22/2019 at [place]  . atorvastatin (LIPITOR) 20 MG tablet Take 2 tablets (40 mg total) by mouth daily at 6 PM. (Patient not taking: Reported on 05/12/2019) 60 tablet 2    Scheduled:  . clopidogrel  75 mg Oral 60 min Pre-Op  . niMODipine  0-60 mg Oral 60 min Pre-Op    Assessment: 68 yo male with ICA stenosis s/p stent assisted angioplasty. Pharmacy consulted to dose heparin  -Hg= 13.9, plt= 185 -heparin running at 500 units/hr post op  Goal of Therapy:  Heparin level 0.1-0.25 units/ml Monitor platelets by anticoagulation protocol: Yes   Plan:   -Increase heparin to 900 units/hr -Heparin level in 8 hours -Heparin to be off at 8am on 7/31 per protocol  Hildred Laser, PharmD Clinical Pharmacist **Pharmacist phone directory can now be found on [email or web address] ([email or web address]).  Listed under [place].

## 2019-05-22 NOTE — Progress Notes (Signed)
Patient ID: Henry Bridges, male   DOB: 12-Jan-1951, 68 y.o.   MRN: [id number] Post procedure. Denies any H/As,N/V ,vision ,speech or motor difficulties. Ox 3 . Speech and comprehension WNLs. Pupils 2 to 3 mm RR reactive. No nystagmus.  No facial asymmetry. Tongue midline. No pronation drift.. Hand grip equal bilaterally.  RT Groin soft. No bleeding or hematoma. Distal pulses  Palpable DPs and PTs bilaterally. S.Makaylyn Sinyard MD

## 2019-05-22 NOTE — Progress Notes (Addendum)
RN spoke with day shift nurse regarding BP parameters.  Was told that PACU nurse spoke with Dr. Estanislado Pandy and that we would be using Artline for BP paramters (120-140) and he was fine if the BP was below the 768 systolic paramteter.  Follow Up: RN spoke with Dr. Estanislado Pandy.  Was told that aforementioned BP note was correct.  Also, the MD ordered that the patient be kept flat throughout the night.

## 2019-05-22 NOTE — Procedures (Signed)
S/P LT ICA stent assisted angioplasty  With distal protection for symptomatic severe stenosis. Arlean Hopping MD

## 2019-05-22 NOTE — Transfer of Care (Addendum)
Immediate Anesthesia Transfer of Care Note  Patient: Henry Bridges  Procedure(s) Performed: RADIOLOGY WITH ANESTHESIA (N/A )  Patient Location: PACU  Anesthesia Type:MAC  Level of Consciousness: awake, alert  and patient cooperative  Airway & Oxygen Therapy: Patient Spontanous Breathing  Post-op Assessment: Report given to RN and Post -op Vital signs reviewed and stable  Post vital signs: Reviewed and stable  Last Vitals:  Vitals Value Taken Time  BP    Temp    Pulse    Resp    SpO2      Last Pain:  Vitals:   05/22/19 0719  TempSrc:   PainSc: 0-No pain         Complications: No apparent anesthesia complications

## 2019-05-22 NOTE — Anesthesia Procedure Notes (Signed)
Arterial Line Insertion Start/End7/30/2020 8:20 AM, 05/22/2019 8:30 AM Performed by: Moshe Salisbury, CRNA, CRNA  Patient location: Pre-op. Preanesthetic checklist: patient identified, IV checked, site marked, risks and benefits discussed, surgical consent, monitors and equipment checked, pre-op evaluation, timeout performed and anesthesia consent Lidocaine 1% used for infiltration and patient sedated Catheter size: 20 G Hand hygiene performed , maximum sterile barriers used  and Seldinger technique used Allen's test indicative of satisfactory collateral circulation Attempts: 1 Procedure performed without using ultrasound guided technique. Following insertion, dressing applied and Biopatch. Post procedure assessment: normal  Patient tolerated the procedure well with no immediate complications.

## 2019-05-22 NOTE — Sedation Documentation (Signed)
ACT 164, notified Dr Estanislado Pandy

## 2019-05-22 NOTE — Anesthesia Postprocedure Evaluation (Signed)
Anesthesia Post Note  Patient: Henry Bridges  Procedure(s) Performed: RADIOLOGY WITH ANESTHESIA (N/A )     Patient location during evaluation: PACU Anesthesia Type: MAC Level of consciousness: awake and alert Pain management: pain level controlled Vital Signs Assessment: post-procedure vital signs reviewed and stable Respiratory status: spontaneous breathing, nonlabored ventilation, respiratory function stable and patient connected to nasal cannula oxygen Cardiovascular status: stable and blood pressure returned to baseline Postop Assessment: no apparent nausea or vomiting Anesthetic complications: no    Last Vitals:  Vitals:   05/22/19 1830 05/22/19 1900  BP:  (!) 103/57  Pulse: (!) 45 (!) 39  Resp: 11 16  Temp:    SpO2: 100% 99%    Last Pain:  Vitals:   05/22/19 1655  TempSrc:   PainSc: 0-No pain    LLE Motor Response: Purposeful movement (05/22/19 1900) LLE Sensation: Full sensation (05/22/19 1900) RLE Motor Response: Purposeful movement (05/22/19 1900) RLE Sensation: Full sensation (05/22/19 1900)      [name]

## 2019-05-23 ENCOUNTER — Encounter (HOSPITAL_COMMUNITY): Payer: Self-pay | Admitting: Interventional Radiology

## 2019-05-23 LAB — CBC WITH DIFFERENTIAL/PLATELET
Abs Immature Granulocytes: 0.01 10*3/uL (ref 0.00–0.07)
Basophils Absolute: 0 10*3/uL (ref 0.0–0.1)
Basophils Relative: 0 %
Eosinophils Absolute: 0.1 10*3/uL (ref 0.0–0.5)
Eosinophils Relative: 2 %
HCT: 35.4 % — ABNORMAL LOW (ref 39.0–52.0)
Hemoglobin: 11.9 g/dL — ABNORMAL LOW (ref 13.0–17.0)
Immature Granulocytes: 0 %
Lymphocytes Relative: 18 %
Lymphs Abs: 0.9 10*3/uL (ref 0.7–4.0)
MCH: 29.5 pg (ref 26.0–34.0)
MCHC: 33.6 g/dL (ref 30.0–36.0)
MCV: 87.8 fL (ref 80.0–100.0)
Monocytes Absolute: 0.4 10*3/uL (ref 0.1–1.0)
Monocytes Relative: 7 %
Neutro Abs: 3.6 10*3/uL (ref 1.7–7.7)
Neutrophils Relative %: 73 %
Platelets: 158 10*3/uL (ref 150–400)
RBC: 4.03 MIL/uL — ABNORMAL LOW (ref 4.22–5.81)
RDW: 12.5 % (ref 11.5–15.5)
WBC: 5 10*3/uL (ref 4.0–10.5)
nRBC: 0 % (ref 0.0–0.2)

## 2019-05-23 LAB — BASIC METABOLIC PANEL
Anion gap: 8 (ref 5–15)
BUN: 19 mg/dL (ref 8–23)
CO2: 22 mmol/L (ref 22–32)
Calcium: 8.6 mg/dL — ABNORMAL LOW (ref 8.9–10.3)
Chloride: 109 mmol/L (ref 98–111)
Creatinine, Ser: 1.07 mg/dL (ref 0.61–1.24)
GFR calc Af Amer: >60 mL/min (ref >60)
GFR calc non Af Amer: >60 mL/min (ref >60)
Glucose, Bld: 111 mg/dL — ABNORMAL HIGH (ref 70–99)
Potassium: 3.6 mmol/L (ref 3.5–5.1)
Sodium: 139 mmol/L (ref 135–145)

## 2019-05-23 LAB — HEPARIN LEVEL (UNFRACTIONATED): Heparin Unfractionated: 0.18 IU/mL — ABNORMAL LOW (ref 0.30–0.70)

## 2019-05-23 NOTE — Discharge Instructions (Signed)
Carotid Angioplasty With Stent, Care After This sheet gives you information about how to care for yourself after your procedure. Your doctor may also give you more specific instructions. If you have problems or questions, contact your doctor. What can I expect after the procedure? After the procedure, it is common to have:  Bruising at the site where the tube (catheter) was inserted. This usually goes away within 1-2 weeks. Follow these instructions at home: Medicines  Take over-the-counter and prescription medicines only as told by your doctor.  Your doctor may prescribe a medicine to thin your blood after the procedure. This will help to prevent blood clots.  If you were prescribed an antibiotic medicine, take it as told by your doctor. Do not stop taking the antibiotic even if you start to feel better.  Ask your doctor if the medicine prescribed to you requires you to avoid driving or using heavy machinery. Incision care   Keep your cut from surgery clean and dry.  Follow instructions from your doctor about how to take care of your cut from surgery. Make sure you: ? Wash your hands with soap and water before and after you change your bandage (dressing). If you cannot use soap and water, use hand sanitizer. ? Change your bandage as told by your doctor. ? Do not rub the site. This may cause bleeding.  Check the area around your cut every day for signs of infection. Check for: ? More redness, swelling, or pain. ? More fluid or blood. ? Warmth. ? Pus or a bad smell. Activity  Return to your normal activities as told by your doctor. Ask your doctor what activities are safe for you.  Do not lift anything that is heavier than 10 lb (4.5 kg), or the limit that you are told, until your doctor says that it is safe.  Avoid sex until your doctor says that this is safe for you. Eating and drinking   Follow instructions from your doctor about what you cannot eat or drink.  Drink  enough fluid to keep your pee (urine) pale yellow.  Eat a heart-healthy diet. This includes foods like fresh fruits and vegetables, whole grains, low-fat dairy products, and low-fat (lean) meats. Avoid foods that are: ? High in salt, saturated fat, or sugar. ? Canned or highly processed. ? Fried. Lifestyle  If you drink alcohol: ? Limit how much you use to:  0-1 drink a day for women.  0-2 drinks a day for men. ? Be aware of how much alcohol is in your drink. In the [place] one drink equals one 12 oz bottle of beer (355 mL), one 5 oz glass of wine (148 mL), or one 1 oz glass of hard liquor (44 mL).  Do not use any products that contain nicotine or tobacco, such as cigarettes, e-cigarettes, and chewing tobacco. If you need help quitting, ask your doctor.  Work with your doctor to keep your blood pressure under control.  Stay at a healthy weight. General instructions  Do not take baths, swim, or use a hot tub for 7 days after procedure.  Avoid straining to poop to keep the cut from bleeding.  Tell all doctors who provide care for you that you have a stent.  Keep all follow-up visits as told by your doctor. This is important. Contact a doctor if:  You have more redness, swelling, or pain around your cut.  You have more fluid or blood coming from your cut.  The area around your  cut feels warm to the touch.  You have pus or a bad smell coming from your cut.  You have a lump caused by bleeding under your skin, and the lump does not go away after 2 weeks.  You have a fever. Get help right away if:  You have a hard time breathing.  You have pain in your chest.  You have any signs of a stroke. You may be at risk for a stroke even after this procedure. You may be at greater risk of a stroke if you have diabetes, lung disease, kidney disease, had a previous stroke, or are 77 years of age or older. "BE FAST" is an easy way to remember the main warning signs: ? B - Balance. Signs  are dizziness, sudden trouble walking, or loss of balance. ? E - Eyes. Signs are trouble seeing or a change in how you see. ? F - Face. Signs are sudden weakness or loss of feeling of the face, or the face or eyelid drooping on one side. ? A - Arms. Signs are weakness or loss of feeling in an arm. This happens suddenly and usually on one side of the body. ? S - Speech. Signs are sudden trouble speaking, slurred speech, or trouble understanding what people say. ? T - Time. Time to call emergency services. Write down what time symptoms started.  You have other signs of a stroke, such as: ? A sudden, very bad headache with no known cause. ? Feeling sick to your stomach (nausea). ? Vomiting. ? Jerky movements you cannot control (seizure).  You notice a lump caused by bleeding under your skin and the lump is quickly getting larger.  You suddenly get pain in the area where your stent was placed.  Your cut from surgery starts to bleed and does not stop after you hold pressure on it for a few minutes. These symptoms may be an emergency. Do not wait to see if the symptoms will go away. Get medical help right away. Call your local emergency services (911 in the [place] Do not drive yourself to the hospital. Summary  After the procedure, it is common to have bruising or a small amount of blood or fluid in the area where the surgery cut was made.  Take over-the-counter and prescription medicines only as told by your doctor.  Return to your normal activities as told by your doctor. Ask your doctor what activities are safe for you.  Make sure you know which symptoms to watch for and when to get medical help right away. This information is not intended to replace advice given to you by your health care provider. Make sure you discuss any questions you have with your health care provider. Document Released: 10/14/2013 Document Revised: 11/18/2018 Document Reviewed: 08/22/2018 Elsevier Patient Education   2020 [employer].

## 2019-05-23 NOTE — Progress Notes (Signed)
All discharge instructions discussed with pt. Pt already has all pills at home so no prescriptions needed. Pt knows to restart metformin tomorrow. Pt will not do any heavy lifting bending or stooping for 2 wks. No driving for 2 weeks. He knows to return for fever, infection or bleeding at groin site. He knows to dial 911 for stroke symptoms. Pt wheeled to car, daughter to drive home.

## 2019-05-23 NOTE — Discharge Summary (Signed)
Patient ID: Henry Bridges MRN: [id number] DOB/AGE: [birth year] 68 y.o.  Admit date: 05/22/2019 Discharge date: 05/23/2019  Supervising Physician: Luanne Bras  Patient Status: [facility] - In-pt  Admission Diagnoses: Stenosis of left carotid artery  Discharge Diagnoses:  Active Problems:   Stenosis of left carotid artery   Discharged Condition: stable  Hospital Course:  Patient presented to [facility] 05/22/2019 for an image-guided cerebral arteriogram with revascularization of left ICA stenosis using stent assisted angioplasty via right femoral approach by Dr. Estanislado Pandy. Procedure occurred without major complications and patient was transferred to neuro ICU in stable condition (VSS, right groin incision stable) for overnight observation. No major events occurred overnight.  Patient awake and alert sitting in chair with no complaints at this time. Denies headache, weakness, numbness/tingling, dizziness, vision changes, hearing changes, tinnitus, or speech difficulty. Right groin incision stable. Plan to discharge home today and follow-up with Dr. Estanislado Pandy in clinic 2 weeks after discharge.   Consults: None  Significant Diagnostic Studies: Ct Angio Head W Or Wo Contrast  Result Date: 05/08/2019 CLINICAL DATA:  TIA EXAM: CT ANGIOGRAPHY HEAD AND NECK TECHNIQUE: Multidetector CT imaging of the head and neck was performed using the standard protocol during bolus administration of intravenous contrast. Multiplanar CT image reconstructions and MIPs were obtained to evaluate the vascular anatomy. Carotid stenosis measurements (when applicable) are obtained utilizing NASCET criteria, using the distal internal carotid diameter as the denominator. CONTRAST:  125mL OMNIPAQUE IOHEXOL 350 MG/ML SOLN COMPARISON:  MRI head 05/08/2019, CT head 05/07/2019 FINDINGS: CTA NECK FINDINGS Aortic arch: 4 vessel arch. Left vertebral artery origin from the arch. No significant atherosclerotic disease dissection or  aneurysm in the aortic arch. Proximal great vessels widely patent. Right carotid system: Right carotid widely patent. Minimal atherosclerotic disease right bifurcation Left carotid system: Left common carotid artery widely patent. Atherosclerotic disease in the left carotid bulb and proximal left internal carotid artery. 50% diameter stenosis of the left internal carotid artery above the bulb. Vertebral arteries: Left vertebral non dominant with origin from the arch. Both vertebral arteries are patent the basilar without significant stenosis. Skeleton: No acute skeletal abnormality. Other neck: Negative for mass or adenopathy Upper chest: Lung apices clear bilaterally. Review of the MIP images confirms the above findings CTA HEAD FINDINGS Anterior circulation: Cavernous carotid widely patent bilaterally without stenosis. Anterior and middle cerebral arteries widely patent bilaterally. Posterior circulation: Both vertebral arteries patent to the basilar. PICA patent bilaterally. Basilar widely patent. Superior cerebellar and posterior cerebral arteries and anterior inferior cerebellar arteries patent bilaterally. Fetal origin right posterior cerebral artery. Venous sinuses: Patent Negative for aneurysm. Review of the MIP images confirms the above findings IMPRESSION: 1. 50% diameter stenosis proximal left internal carotid artery 2. Right carotid artery widely patent. Both vertebral arteries widely patent. 3. Negative for intracranial large vessel occlusion or significant stenosis. Electronically Signed   By: Franchot Gallo M.D.   On: 05/08/2019 19:17   Ct Angio Neck W Or Wo Contrast  Result Date: 05/08/2019 CLINICAL DATA:  TIA EXAM: CT ANGIOGRAPHY HEAD AND NECK TECHNIQUE: Multidetector CT imaging of the head and neck was performed using the standard protocol during bolus administration of intravenous contrast. Multiplanar CT image reconstructions and MIPs were obtained to evaluate the vascular anatomy. Carotid  stenosis measurements (when applicable) are obtained utilizing NASCET criteria, using the distal internal carotid diameter as the denominator. CONTRAST:  121mL OMNIPAQUE IOHEXOL 350 MG/ML SOLN COMPARISON:  MRI head 05/08/2019, CT head 05/07/2019 FINDINGS: CTA NECK FINDINGS Aortic arch:  4 vessel arch. Left vertebral artery origin from the arch. No significant atherosclerotic disease dissection or aneurysm in the aortic arch. Proximal great vessels widely patent. Right carotid system: Right carotid widely patent. Minimal atherosclerotic disease right bifurcation Left carotid system: Left common carotid artery widely patent. Atherosclerotic disease in the left carotid bulb and proximal left internal carotid artery. 50% diameter stenosis of the left internal carotid artery above the bulb. Vertebral arteries: Left vertebral non dominant with origin from the arch. Both vertebral arteries are patent the basilar without significant stenosis. Skeleton: No acute skeletal abnormality. Other neck: Negative for mass or adenopathy Upper chest: Lung apices clear bilaterally. Review of the MIP images confirms the above findings CTA HEAD FINDINGS Anterior circulation: Cavernous carotid widely patent bilaterally without stenosis. Anterior and middle cerebral arteries widely patent bilaterally. Posterior circulation: Both vertebral arteries patent to the basilar. PICA patent bilaterally. Basilar widely patent. Superior cerebellar and posterior cerebral arteries and anterior inferior cerebellar arteries patent bilaterally. Fetal origin right posterior cerebral artery. Venous sinuses: Patent Negative for aneurysm. Review of the MIP images confirms the above findings IMPRESSION: 1. 50% diameter stenosis proximal left internal carotid artery 2. Right carotid artery widely patent. Both vertebral arteries widely patent. 3. Negative for intracranial large vessel occlusion or significant stenosis. Electronically Signed   By: Franchot Gallo  M.D.   On: 05/08/2019 19:17   [name] Contrast  Result Date: 05/08/2019 CLINICAL DATA:  68 y/o M; headache, blurred vision, right arm weakness. TIA, initial exam. History of tongue cancer post resection and radiation. EXAM: MRI HEAD WITHOUT CONTRAST TECHNIQUE: Multiplanar, multiecho pulse sequences of the brain and surrounding structures were obtained without intravenous contrast. COMPARISON:  05/07/2019 CT head. FINDINGS: Brain: Mildly increased diffusion signal and mildly decreased ADC of the left caudate nucleus head and body on the axial diffusion sequence (series 8, image 30) and to lesser degree on the coronal diffusion sequence (series 1210, image 22). No associated hemorrhage or mass effect. On the axial DWI sequence there is also increased signal within the left anterior cingulate gyrus, right insula, and right hippocampus, however, this is not replicated on the coronal sequences and may be artifactual. No additional corresponding signal abnormality on other sequences. No acute hemorrhage, hydrocephalus, extra-axial collection, or mass effect. Scattered punctate nonspecific T2 FLAIR hyperintensities in subcortical and periventricular white matter are compatible with mild chronic microvascular ischemic changes. Mild volume loss of the brain. Vascular: Normal flow voids. Skull and upper cervical spine: Normal marrow signal. Sinuses/Orbits: Negative. Other: None. IMPRESSION: Mildly decreased diffusion of the left caudate nucleus. Findings may represent acute/early subacute infarction, however, the distribution is somewhat atypical for lacunar infarction raising the question of seizure related activity or metabolic/toxic injury. Electronically Signed   By: Kristine Garbe M.D.   On: 05/08/2019 00:24   Mr Jeri Cos KG Contrast  Result Date: 05/08/2019 CLINICAL DATA:  TIA.  Hand weakness.  History of tongue cancer EXAM: MRI HEAD WITHOUT AND WITH CONTRAST TECHNIQUE: Multiplanar, multiecho pulse  sequences of the brain and surrounding structures were obtained without and with intravenous contrast. CONTRAST:  10 mL Gadovist IV COMPARISON:  MRI head 05/07/2019 FINDINGS: Brain: Negative for acute infarct. Previously noted signal in the left caudate on axial diffusion imaging no longer present and likely was artifact. Different machines utilized on the 2 studies. Ventricle size normal. Scattered small white matter hyperintensities bilaterally appear chronic. Negative for hemorrhage mass or fluid collection. No midline shift. Normal enhancement. No evidence of metastatic disease.  No abnormal enhancement in the left caudate region. Vascular: Normal arterial flow voids Skull and upper cervical spine: Negative Sinuses/Orbits: Negative Other: None IMPRESSION: No acute abnormality Diffusion-weighted signal left caudate on yesterday's study appears to be artifactual. Mild chronic white matter changes likely due to microvascular ischemia. Electronically Signed   By: Franchot Gallo M.D.   On: 05/08/2019 13:16   Ir Angiogram Extremity Left  Result Date: 05/13/2019 CLINICAL DATA:  History of left sided amaurosis fugax, and of transient right-sided paresthesias. CT angiogram of the head and neck revealing significant stenosis of the proximal left internal carotid artery. EXAM: IR ANGIO VERTEBRAL SEL SUBCLAVIAN INNOMINATE UNI LEFT MOD SED; LEFT EXTREMITY ARTERIOGRAPHY; IR ULTRASOUND GUIDANCE VASC ACCESS RIGHT; BILATERAL COMMON CAROTID AND INNOMINATE ANGIOGRAPHY; IR ANGIO VERTEBRAL SEL VERTEBRAL UNI RIGHT MOD SED COMPARISON:  CT angiogram of the head and neck of 05/08/2019. MEDICATIONS: Heparin 2000 units intra-arterially. No antibiotic was administered within 1 hour of the procedure. ANESTHESIA/SEDATION: Versed 1 mg IV; Fentanyl 25 mcg IV. Moderate Sedation Time:  35 minutes minutes. The patient was continuously monitored during the procedure by the interventional radiology nurse under my direct supervision. CONTRAST:   Isovue 300 approximately 70 mm. FLUOROSCOPY TIME:  Fluoroscopy Time: 18 minutes 36 seconds (867 mGy). COMPLICATIONS: None immediate. TECHNIQUE: Informed written consent was obtained from the patient after a thorough discussion of the procedural risks, benefits and alternatives. All questions were addressed. Maximal Sterile Barrier Technique was utilized including caps, mask, sterile gowns, sterile gloves, sterile drape, hand hygiene and skin antiseptic. A timeout was performed prior to the initiation of the procedure. The right forearm to the wrist was prepped and draped in the usual sterile fashion. The right radial artery was then identified, and a dorsal palmar anastomosis verified. The right radial artery morphology was then evaluated and documented with ultrasound imaging. Using ultrasound guidance transradial access into the right radial artery was then obtained with a micropuncture needle. Over a 0.018 inch micro guidewire, a 4/5 French radial sheath was then advanced into the right radial artery without difficulty. Good aspiration was obtained from the side port of the radial sheath. A cocktail of 2000 units of heparin, 2.5 mg of verapamil, and 200 mcg of nitroglycerin were then injected in a diluted form without difficulty. A right radial angiogram was obtained. Over a 0.035 inch Roadrunner guidewire, a 5 [place] Simmons 2 diagnostic catheter was then advanced to the aortic arch region, and formed without difficulty. Selective cannulations were then performed with the right common carotid artery, the left common carotid artery, the left subclavian artery, the left vertebral artery, and the right vertebral artery. Following the procedure, the diagnostic catheter was removed. The right radial sheath was then removed with the application of the wrist band for hemostasis. The distal right radial pulse was then verified at the end of the procedure. FINDINGS: The right common carotid arteriogram demonstrates the  right external carotid artery and its major branches to be widely patent. The right internal carotid artery at the bulb to the cranial skull base demonstrates wide patency. The petrous, cavernous and supraclinoid segments are widely patent. Right posterior communicating artery is seen opacifying the right posterior cerebral distribution. The right middle cerebral artery and the right anterior cerebral artery opacify into the capillary and venous phases. The left common carotid arteriogram demonstrates the left external carotid artery and its major branches to be widely patent. The left internal carotid artery just distal to the bulb has a severe tapered narrowing of approximately  70% by the NASCET criteria. Distal to this the vessel then resumes normal caliber in its distal cervical segment. The petrous, cavernous and the supraclinoid segments are widely patent. The left middle cerebral artery and the left anterior cerebral artery opacify into the capillary and venous phases. A small infundibulum is noted at the origin of the left posterior-inferior cerebellar artery. The left middle cerebral artery and the left anterior cerebral artery opacify into the capillary and venous phases. The left subclavian arteriogram demonstrates ascending cervical branch of the left thyrocervical trunk. This ascends to the level of C2. The left vertebral artery origin is between the origins of the left subclavian artery, and the left common carotid artery. The vessel is seen to meander to the cranial skull base. Opacification is seen of the left vertebrobasilar junction and the left posterior-inferior cerebellar artery. On the lateral projection, the opacified portion of the basilar artery, the superior cerebellar arteries, the posterior cerebral artery on the left side, and of the anterior-inferior cerebellar arteries is grossly intact into the delayed arterial phase. Unopacified blood is seen in the basilar artery from the  contralateral vertebral artery. The dominant right vertebral artery origin is widely patent. The vessel is seen to opacify to the cranial skull base. Wide patency is seen of the right posterior-inferior cerebellar artery and the right vertebrobasilar junction. The basilar artery, the left posterior cerebral artery, the superior cerebellar arteries and anterior inferior cerebellar arteries opacify into the capillary and venous phases. Nonvisualization of the right posterior cerebral artery is related to dominant right PCOM opacifying from the right internal carotid artery. 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 PLAN: Findings were reviewed with the patient. They understand the symptomatic nature of the left internal carotid artery severe stenosis given his recent symptoms. In order to prevent further left cerebral hemispheric ischemic events related to this stenosis, endovascular revascularization with distal protection was discussed. The procedure was discussed with the patient. This would be with distal protection with a risk of 1-2% chance of an ischemic stroke. Very remote possibility of reperfusion hemorrhage was also reviewed. The patient expressed understanding and wanted to proceed with endovascular revascularization of the symptomatic stenosis of the left internal carotid artery. Electronically Signed   By: Luanne Bras M.D.   On: 05/12/2019 13:20   Ir US Guide Vasc Access Right  Result Date: 05/13/2019 CLINICAL DATA:  History of left sided amaurosis fugax, and of transient right-sided paresthesias. CT angiogram of the head and neck revealing significant stenosis of the proximal left internal carotid artery. EXAM: IR ANGIO VERTEBRAL SEL SUBCLAVIAN INNOMINATE UNI LEFT MOD SED; LEFT EXTREMITY ARTERIOGRAPHY; IR ULTRASOUND GUIDANCE VASC ACCESS RIGHT; BILATERAL COMMON CAROTID AND INNOMINATE ANGIOGRAPHY; IR ANGIO  VERTEBRAL SEL VERTEBRAL UNI RIGHT MOD SED COMPARISON:  CT angiogram of the head and neck of 05/08/2019. MEDICATIONS: Heparin 2000 units intra-arterially. No antibiotic was administered within 1 hour of the procedure. ANESTHESIA/SEDATION: Versed 1 mg IV; Fentanyl 25 mcg IV. Moderate Sedation Time:  35 minutes minutes. The patient was continuously monitored during the procedure by the interventional radiology nurse under my direct supervision. CONTRAST:  Isovue 300 approximately 70 mm. FLUOROSCOPY TIME:  Fluoroscopy Time: 18 minutes 36 seconds (867 mGy). COMPLICATIONS: None immediate. TECHNIQUE: Informed written consent was obtained from the patient after a thorough discussion of the procedural risks, benefits and alternatives. All questions were addressed. Maximal Sterile Barrier Technique was utilized including caps, mask, sterile gowns,  sterile gloves, sterile drape, hand hygiene and skin antiseptic. A timeout was performed prior to the initiation of the procedure. The right forearm to the wrist was prepped and draped in the usual sterile fashion. The right radial artery was then identified, and a dorsal palmar anastomosis verified. The right radial artery morphology was then evaluated and documented with ultrasound imaging. Using ultrasound guidance transradial access into the right radial artery was then obtained with a micropuncture needle. Over a 0.018 inch micro guidewire, a 4/5 French radial sheath was then advanced into the right radial artery without difficulty. Good aspiration was obtained from the side port of the radial sheath. A cocktail of 2000 units of heparin, 2.5 mg of verapamil, and 200 mcg of nitroglycerin were then injected in a diluted form without difficulty. A right radial angiogram was obtained. Over a 0.035 inch Roadrunner guidewire, a 5 [place] Simmons 2 diagnostic catheter was then advanced to the aortic arch region, and formed without difficulty. Selective cannulations were then performed  with the right common carotid artery, the left common carotid artery, the left subclavian artery, the left vertebral artery, and the right vertebral artery. Following the procedure, the diagnostic catheter was removed. The right radial sheath was then removed with the application of the wrist band for hemostasis. The distal right radial pulse was then verified at the end of the procedure. FINDINGS: The right common carotid arteriogram demonstrates the right external carotid artery and its major branches to be widely patent. The right internal carotid artery at the bulb to the cranial skull base demonstrates wide patency. The petrous, cavernous and supraclinoid segments are widely patent. Right posterior communicating artery is seen opacifying the right posterior cerebral distribution. The right middle cerebral artery and the right anterior cerebral artery opacify into the capillary and venous phases. The left common carotid arteriogram demonstrates the left external carotid artery and its major branches to be widely patent. The left internal carotid artery just distal to the bulb has a severe tapered narrowing of approximately 70% by the NASCET criteria. Distal to this the vessel then resumes normal caliber in its distal cervical segment. The petrous, cavernous and the supraclinoid segments are widely patent. The left middle cerebral artery and the left anterior cerebral artery opacify into the capillary and venous phases. A small infundibulum is noted at the origin of the left posterior-inferior cerebellar artery. The left middle cerebral artery and the left anterior cerebral artery opacify into the capillary and venous phases. The left subclavian arteriogram demonstrates ascending cervical branch of the left thyrocervical trunk. This ascends to the level of C2. The left vertebral artery origin is between the origins of the left subclavian artery, and the left common carotid artery. The vessel is seen to meander to  the cranial skull base. Opacification is seen of the left vertebrobasilar junction and the left posterior-inferior cerebellar artery. On the lateral projection, the opacified portion of the basilar artery, the superior cerebellar arteries, the posterior cerebral artery on the left side, and of the anterior-inferior cerebellar arteries is grossly intact into the delayed arterial phase. Unopacified blood is seen in the basilar artery from the contralateral vertebral artery. The dominant right vertebral artery origin is widely patent. The vessel is seen to opacify to the cranial skull base. Wide patency is seen of the right posterior-inferior cerebellar artery and the right vertebrobasilar junction. The basilar artery, the left posterior cerebral artery, the superior cerebellar arteries and anterior inferior cerebellar arteries opacify into the capillary and venous phases.  Nonvisualization of the right posterior cerebral artery is related to dominant right PCOM opacifying from the right internal carotid artery. 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 PLAN: Findings were reviewed with the patient. They understand the symptomatic nature of the left internal carotid artery severe stenosis given his recent symptoms. In order to prevent further left cerebral hemispheric ischemic events related to this stenosis, endovascular revascularization with distal protection was discussed. The procedure was discussed with the patient. This would be with distal protection with a risk of 1-2% chance of an ischemic stroke. Very remote possibility of reperfusion hemorrhage was also reviewed. The patient expressed understanding and wanted to proceed with endovascular revascularization of the symptomatic stenosis of the left internal carotid artery. Electronically Signed   By: Luanne Bras M.D.   On: 05/12/2019 13:20   Ct Head Code Stroke Wo  Contrast  Result Date: 05/07/2019 CLINICAL DATA:  Code stroke. Headache above the left eye, blurred vision, and right hand weakness lasting 15-30 minutes. EXAM: CT HEAD WITHOUT CONTRAST TECHNIQUE: Contiguous axial images were obtained from the base of the skull through the vertex without intravenous contrast. COMPARISON:  None. FINDINGS: Brain: There is no evidence of acute infarct, intracranial hemorrhage, mass, midline shift, or extra-axial fluid collection. The ventricles and sulci are within normal limits for age. Patchy hypodensities in the cerebral white matter bilaterally are nonspecific but compatible with mild chronic small vessel ischemic disease. Vascular: No hyperdense vessel. Skull: No fracture or suspicious osseous lesion. Sinuses/Orbits: Visualized paranasal sinuses and mastoid air cells are clear. Visualized orbits are unremarkable. Other: None. ASPECTS [facility] Stroke Program Early CT Score) - Ganglionic level infarction (caudate, lentiform nuclei, internal capsule, insula, M1-M3 cortex): 7 - Supraganglionic infarction (M4-M6 cortex): 3 Total score (0-10 with 10 being normal): 10 IMPRESSION: 1. No evidence of acute intracranial abnormality. 2. ASPECTS is 10. 3. Mild chronic small vessel ischemic disease. These results were called by telephone at the time of interpretation on 05/07/2019 at 1:00 pm to Dr. Aletta Edouard , who verbally acknowledged these results. Electronically Signed   By: Logan Bores M.D.   On: 05/07/2019 13:01   Vas US Carotid (at [facility] Only)  Result Date: 05/10/2019 Carotid Arterial Duplex Study Indications:       TIA. Risk Factors:      Hypertension. Comparison Study:  no prior Performing Technologist: Abram Sander RVS  Examination Guidelines: A complete evaluation includes B-mode imaging, spectral Doppler, color Doppler, and power Doppler as needed of all accessible portions of each vessel. Bilateral testing is considered an integral part of a complete examination.  Limited examinations for reoccurring indications may be performed as noted.  Right Carotid Findings: +----------+--------+--------+--------+------------+--------+             PSV cm/s EDV cm/s Stenosis Describe     Comments  +----------+--------+--------+--------+------------+--------+  CCA Prox   85       16                heterogenous           +----------+--------+--------+--------+------------+--------+  CCA Distal 75       23                heterogenous           +----------+--------+--------+--------+------------+--------+  ICA Prox   81       27       1-39%    heterogenous           +----------+--------+--------+--------+------------+--------+  ICA Distal 63       22                                       +----------+--------+--------+--------+------------+--------+  ECA        72       7                                        +----------+--------+--------+--------+------------+--------+ +----------+--------+-------+--------+-------------------+             PSV cm/s EDV cms Describe Arm Pressure (mmHG)  +----------+--------+-------+--------+-------------------+  Subclavian 100                                            +----------+--------+-------+--------+-------------------+ +---------+--------+--+--------+--+---------+  Vertebral PSV cm/s 37 EDV cm/s 10 Antegrade  +---------+--------+--+--------+--+---------+  Left Carotid Findings: +----------+--------+--------+--------+------------+--------+             PSV cm/s EDV cm/s Stenosis Describe     Comments  +----------+--------+--------+--------+------------+--------+  CCA Prox   73       16                                       +----------+--------+--------+--------+------------+--------+  CCA Distal 67       16                heterogenous           +----------+--------+--------+--------+------------+--------+  ICA Prox   237      83       60-79%   heterogenous           +----------+--------+--------+--------+------------+--------+  ICA Mid    166      61                                        +----------+--------+--------+--------+------------+--------+  ICA Distal 107      23                                       +----------+--------+--------+--------+------------+--------+  ECA        96       14                                       +----------+--------+--------+--------+------------+--------+ +----------+--------+--------+--------+-------------------+  Subclavian PSV cm/s EDV cm/s Describe Arm Pressure (mmHG)  +----------+--------+--------+--------+-------------------+             104                                             +----------+--------+--------+--------+-------------------+ +---------+--------+--+--------+-+---------+  Vertebral PSV cm/s 38 EDV cm/s 6 Antegrade  +---------+--------+--+--------+-+---------+  Summary: Right Carotid: Velocities in the right ICA are consistent with a 1-39% stenosis. Left Carotid: Velocities  in the left ICA are consistent with a 60-79% stenosis. Vertebrals: Bilateral vertebral arteries demonstrate antegrade flow. *See table(s) above for measurements and observations.  Electronically signed by Antony Contras MD on 05/10/2019 at 11:38:51 AM.    Final    Ir Angio Intra Extracran Sel Com Carotid Innominate Bilat Mod Sed  Result Date: 05/13/2019 CLINICAL DATA:  History of left sided amaurosis fugax, and of transient right-sided paresthesias. CT angiogram of the head and neck revealing significant stenosis of the proximal left internal carotid artery. EXAM: IR ANGIO VERTEBRAL SEL SUBCLAVIAN INNOMINATE UNI LEFT MOD SED; LEFT EXTREMITY ARTERIOGRAPHY; IR ULTRASOUND GUIDANCE VASC ACCESS RIGHT; BILATERAL COMMON CAROTID AND INNOMINATE ANGIOGRAPHY; IR ANGIO VERTEBRAL SEL VERTEBRAL UNI RIGHT MOD SED COMPARISON:  CT angiogram of the head and neck of 05/08/2019. MEDICATIONS: Heparin 2000 units intra-arterially. No antibiotic was administered within 1 hour of the procedure. ANESTHESIA/SEDATION: Versed 1 mg IV; Fentanyl 25 mcg IV. Moderate  Sedation Time:  35 minutes minutes. The patient was continuously monitored during the procedure by the interventional radiology nurse under my direct supervision. CONTRAST:  Isovue 300 approximately 70 mm. FLUOROSCOPY TIME:  Fluoroscopy Time: 18 minutes 36 seconds (867 mGy). COMPLICATIONS: None immediate. TECHNIQUE: Informed written consent was obtained from the patient after a thorough discussion of the procedural risks, benefits and alternatives. All questions were addressed. Maximal Sterile Barrier Technique was utilized including caps, mask, sterile gowns, sterile gloves, sterile drape, hand hygiene and skin antiseptic. A timeout was performed prior to the initiation of the procedure. The right forearm to the wrist was prepped and draped in the usual sterile fashion. The right radial artery was then identified, and a dorsal palmar anastomosis verified. The right radial artery morphology was then evaluated and documented with ultrasound imaging. Using ultrasound guidance transradial access into the right radial artery was then obtained with a micropuncture needle. Over a 0.018 inch micro guidewire, a 4/5 French radial sheath was then advanced into the right radial artery without difficulty. Good aspiration was obtained from the side port of the radial sheath. A cocktail of 2000 units of heparin, 2.5 mg of verapamil, and 200 mcg of nitroglycerin were then injected in a diluted form without difficulty. A right radial angiogram was obtained. Over a 0.035 inch Roadrunner guidewire, a 5 [place] Simmons 2 diagnostic catheter was then advanced to the aortic arch region, and formed without difficulty. Selective cannulations were then performed with the right common carotid artery, the left common carotid artery, the left subclavian artery, the left vertebral artery, and the right vertebral artery. Following the procedure, the diagnostic catheter was removed. The right radial sheath was then removed with the application of  the wrist band for hemostasis. The distal right radial pulse was then verified at the end of the procedure. FINDINGS: The right common carotid arteriogram demonstrates the right external carotid artery and its major branches to be widely patent. The right internal carotid artery at the bulb to the cranial skull base demonstrates wide patency. The petrous, cavernous and supraclinoid segments are widely patent. Right posterior communicating artery is seen opacifying the right posterior cerebral distribution. The right middle cerebral artery and the right anterior cerebral artery opacify into the capillary and venous phases. The left common carotid arteriogram demonstrates the left external carotid artery and its major branches to be widely patent. The left internal carotid artery just distal to the bulb has a severe tapered narrowing of approximately 70% by the NASCET criteria. Distal to this the vessel then resumes normal caliber  in its distal cervical segment. The petrous, cavernous and the supraclinoid segments are widely patent. The left middle cerebral artery and the left anterior cerebral artery opacify into the capillary and venous phases. A small infundibulum is noted at the origin of the left posterior-inferior cerebellar artery. The left middle cerebral artery and the left anterior cerebral artery opacify into the capillary and venous phases. The left subclavian arteriogram demonstrates ascending cervical branch of the left thyrocervical trunk. This ascends to the level of C2. The left vertebral artery origin is between the origins of the left subclavian artery, and the left common carotid artery. The vessel is seen to meander to the cranial skull base. Opacification is seen of the left vertebrobasilar junction and the left posterior-inferior cerebellar artery. On the lateral projection, the opacified portion of the basilar artery, the superior cerebellar arteries, the posterior cerebral artery on the left  side, and of the anterior-inferior cerebellar arteries is grossly intact into the delayed arterial phase. Unopacified blood is seen in the basilar artery from the contralateral vertebral artery. The dominant right vertebral artery origin is widely patent. The vessel is seen to opacify to the cranial skull base. Wide patency is seen of the right posterior-inferior cerebellar artery and the right vertebrobasilar junction. The basilar artery, the left posterior cerebral artery, the superior cerebellar arteries and anterior inferior cerebellar arteries opacify into the capillary and venous phases. Nonvisualization of the right posterior cerebral artery is related to dominant right PCOM opacifying from the right internal carotid artery. 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 PLAN: Findings were reviewed with the patient. They understand the symptomatic nature of the left internal carotid artery severe stenosis given his recent symptoms. In order to prevent further left cerebral hemispheric ischemic events related to this stenosis, endovascular revascularization with distal protection was discussed. The procedure was discussed with the patient. This would be with distal protection with a risk of 1-2% chance of an ischemic stroke. Very remote possibility of reperfusion hemorrhage was also reviewed. The patient expressed understanding and wanted to proceed with endovascular revascularization of the symptomatic stenosis of the left internal carotid artery. Electronically Signed   By: Luanne Bras M.D.   On: 05/12/2019 13:20   Ir Angio Vertebral Sel Vertebral Bilat Mod Sed  Result Date: 05/13/2019 CLINICAL DATA:  History of left sided amaurosis fugax, and of transient right-sided paresthesias. CT angiogram of the head and neck revealing significant stenosis of the proximal left internal carotid artery. EXAM: IR ANGIO  VERTEBRAL SEL SUBCLAVIAN INNOMINATE UNI LEFT MOD SED; LEFT EXTREMITY ARTERIOGRAPHY; IR ULTRASOUND GUIDANCE VASC ACCESS RIGHT; BILATERAL COMMON CAROTID AND INNOMINATE ANGIOGRAPHY; IR ANGIO VERTEBRAL SEL VERTEBRAL UNI RIGHT MOD SED COMPARISON:  CT angiogram of the head and neck of 05/08/2019. MEDICATIONS: Heparin 2000 units intra-arterially. No antibiotic was administered within 1 hour of the procedure. ANESTHESIA/SEDATION: Versed 1 mg IV; Fentanyl 25 mcg IV. Moderate Sedation Time:  35 minutes minutes. The patient was continuously monitored during the procedure by the interventional radiology nurse under my direct supervision. CONTRAST:  Isovue 300 approximately 70 mm. FLUOROSCOPY TIME:  Fluoroscopy Time: 18 minutes 36 seconds (867 mGy). COMPLICATIONS: None immediate. TECHNIQUE: Informed written consent was obtained from the patient after a thorough discussion of the procedural risks, benefits and alternatives. All questions were addressed. Maximal Sterile Barrier Technique was utilized including caps, mask, sterile gowns, sterile gloves, sterile drape, hand hygiene and skin antiseptic. A timeout was  performed prior to the initiation of the procedure. The right forearm to the wrist was prepped and draped in the usual sterile fashion. The right radial artery was then identified, and a dorsal palmar anastomosis verified. The right radial artery morphology was then evaluated and documented with ultrasound imaging. Using ultrasound guidance transradial access into the right radial artery was then obtained with a micropuncture needle. Over a 0.018 inch micro guidewire, a 4/5 French radial sheath was then advanced into the right radial artery without difficulty. Good aspiration was obtained from the side port of the radial sheath. A cocktail of 2000 units of heparin, 2.5 mg of verapamil, and 200 mcg of nitroglycerin were then injected in a diluted form without difficulty. A right radial angiogram was obtained. Over a 0.035  inch Roadrunner guidewire, a 5 [place] Simmons 2 diagnostic catheter was then advanced to the aortic arch region, and formed without difficulty. Selective cannulations were then performed with the right common carotid artery, the left common carotid artery, the left subclavian artery, the left vertebral artery, and the right vertebral artery. Following the procedure, the diagnostic catheter was removed. The right radial sheath was then removed with the application of the wrist band for hemostasis. The distal right radial pulse was then verified at the end of the procedure. FINDINGS: The right common carotid arteriogram demonstrates the right external carotid artery and its major branches to be widely patent. The right internal carotid artery at the bulb to the cranial skull base demonstrates wide patency. The petrous, cavernous and supraclinoid segments are widely patent. Right posterior communicating artery is seen opacifying the right posterior cerebral distribution. The right middle cerebral artery and the right anterior cerebral artery opacify into the capillary and venous phases. The left common carotid arteriogram demonstrates the left external carotid artery and its major branches to be widely patent. The left internal carotid artery just distal to the bulb has a severe tapered narrowing of approximately 70% by the NASCET criteria. Distal to this the vessel then resumes normal caliber in its distal cervical segment. The petrous, cavernous and the supraclinoid segments are widely patent. The left middle cerebral artery and the left anterior cerebral artery opacify into the capillary and venous phases. A small infundibulum is noted at the origin of the left posterior-inferior cerebellar artery. The left middle cerebral artery and the left anterior cerebral artery opacify into the capillary and venous phases. The left subclavian arteriogram demonstrates ascending cervical branch of the left thyrocervical trunk.  This ascends to the level of C2. The left vertebral artery origin is between the origins of the left subclavian artery, and the left common carotid artery. The vessel is seen to meander to the cranial skull base. Opacification is seen of the left vertebrobasilar junction and the left posterior-inferior cerebellar artery. On the lateral projection, the opacified portion of the basilar artery, the superior cerebellar arteries, the posterior cerebral artery on the left side, and of the anterior-inferior cerebellar arteries is grossly intact into the delayed arterial phase. Unopacified blood is seen in the basilar artery from the contralateral vertebral artery. The dominant right vertebral artery origin is widely patent. The vessel is seen to opacify to the cranial skull base. Wide patency is seen of the right posterior-inferior cerebellar artery and the right vertebrobasilar junction. The basilar artery, the left posterior cerebral artery, the superior cerebellar arteries and anterior inferior cerebellar arteries opacify into the capillary and venous phases. Nonvisualization of the right posterior cerebral artery is related to dominant right  PCOM opacifying from the right internal carotid artery. 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 PLAN: Findings were reviewed with the patient. They understand the symptomatic nature of the left internal carotid artery severe stenosis given his recent symptoms. In order to prevent further left cerebral hemispheric ischemic events related to this stenosis, endovascular revascularization with distal protection was discussed. The procedure was discussed with the patient. This would be with distal protection with a risk of 1-2% chance of an ischemic stroke. Very remote possibility of reperfusion hemorrhage was also reviewed. The patient expressed understanding and wanted to proceed with  endovascular revascularization of the symptomatic stenosis of the left internal carotid artery. Electronically Signed   By: Luanne Bras M.D.   On: 05/12/2019 13:20    Treatments: Endovascular revascularization of left ICA stenosis using stent assisted angioplasty.  Discharge Exam: Blood pressure 104/65, pulse (!) 41, temperature 98.2 F (36.8 C), temperature source Oral, resp. rate 13, height 5\' 10"  (1.778 m), weight 230 lb (104.3 kg), SpO2 99 %. Physical Exam Vitals signs and nursing note reviewed.  Constitutional:      General: He is not in acute distress.    Appearance: Normal appearance.  Cardiovascular:     Rate and Rhythm: Normal rate and regular rhythm.     Heart sounds: Normal heart sounds. No murmur.  Pulmonary:     Effort: Pulmonary effort is normal. No respiratory distress.     Breath sounds: Normal breath sounds. No wheezing.  Skin:    General: Skin is warm and dry.     Comments: Right groin incision soft without active bleeding or hematoma.  Neurological:     Mental Status: He is alert.     Comments: Alert, awake, and oriented x3. Speech and comprehension intact. PERRL bilaterally. EOMs intact bilaterally without nystagmus or subjective diplopia. No facial asymmetry. Tongue midline. Motor power symmetric proportional to effort. No pronator drift. Distal pulses 1+ bilaterally.  Psychiatric:        Mood and Affect: Mood normal.        Behavior: Behavior normal.        Thought Content: Thought content normal.        Judgment: Judgment normal.     Disposition: Discharge disposition: 01-Home or Self Care       Discharge Instructions    Call MD for:  difficulty breathing, headache or visual disturbances   Complete by: As directed    Call MD for:  extreme fatigue   Complete by: As directed    Call MD for:  hives   Complete by: As directed    Call MD for:  persistant dizziness or light-headedness   Complete by: As directed    Call MD for:   persistant nausea and vomiting   Complete by: As directed    Call MD for:  redness, tenderness, or signs of infection (pain, swelling, redness, odor or green/yellow discharge around incision site)   Complete by: As directed    Call MD for:  severe uncontrolled pain   Complete by: As directed    Call MD for:  temperature >100.4   Complete by: As directed    Diet - low sodium heart healthy   Complete by: As directed    Discharge instructions   Complete by: As directed    Continue taking Brilina 90 mg twice daily. Continue taking Aspirin 81 mg once daily. No bending, stooping, or lifting  more than 10 pounds for 2 weeks. No driving self for 2 weeks. Stay hydrated by drinking plenty of water. Restart Metformin 05/24/2019.   Increase activity slowly   Complete by: As directed    No dressing needed   Complete by: As directed      Allergies as of 05/23/2019      Reactions   Aspirin Other (See Comments)   Made his heartburn worse      Medication List    TAKE these medications   amLODipine 10 MG tablet Commonly known as: NORVASC Take 10 mg by mouth daily.   aspirin EC 81 MG tablet Take 81 mg by mouth daily.   atorvastatin 40 MG tablet Commonly known as: LIPITOR Take 40 mg by mouth daily at 6 PM.   atorvastatin 20 MG tablet Commonly known as: LIPITOR Take 2 tablets (40 mg total) by mouth daily at 6 PM.   doxazosin 2 MG tablet Commonly known as: CARDURA Take 2 mg by mouth daily.   Fish Oil 1000 MG Cpdr Take 1,000-2,000 mg by mouth See admin instructions. Take 2 capsules in the morning, and 1 capsules in the evening   hydrochlorothiazide 25 MG tablet Commonly known as: HYDRODIURIL Take 25 mg by mouth daily.   lansoprazole 30 MG capsule Commonly known as: PREVACID Take 30 mg by mouth daily at 12 noon.   levothyroxine 125 MCG tablet Commonly known as: SYNTHROID Take 125 mcg by mouth daily before breakfast.   losartan 100 MG tablet Commonly known as: COZAAR Take 100  mg by mouth daily.   metFORMIN 1000 MG tablet Commonly known as: GLUCOPHAGE Take 1,000 mg by mouth 2 (two) times daily with a meal.   ticagrelor 90 MG Tabs tablet Commonly known as: BRILINTA Take by mouth 2 (two) times daily.      Follow-up Information    Luanne Bras, MD Follow up in 2 week(s).   Specialties: Interventional Radiology, Radiology Why: Please follow-up with Dr. Estanislado Pandy in clinic 2 weeks after discharge. Our office will call you to set up this appointment. Contact information: [address] [place] [address] [phone]            Electronically Signed: Earley Abide, PA-C 05/23/2019, 8:53 AM   I have spent Less Than 30 Minutes discharging Keenan Bachelor.

## 2019-05-26 ENCOUNTER — Encounter (HOSPITAL_COMMUNITY): Payer: Self-pay | Admitting: Interventional Radiology

## 2019-06-03 ENCOUNTER — Telehealth: Payer: Self-pay | Admitting: Student

## 2019-06-03 NOTE — Telephone Encounter (Signed)
NIR.  Patient with history of left ICA stenosis s/p endovascular revascularization using stent assisted angioplasty 05/22/2019 by Dr. Estanislado Pandy. Received message from patient complaining of "elongated lump in right groin near access site". Patient brought into [facility] for evaluation today.  Assessed patient at [facility] in IR. Right groin soft with approximately <1cm mobile linear lump at groin fold on incision site, with surrounding light yellow/brown ecchymosis. No tenderness, erythema, drainage, or active bleeding noted. Right femoral pulse intact. ?possible fibrotic tissue from procedure. Discussed case with Dr. Estanislado Pandy who agrees. Advised patient that if he notes pain at right groin incision with walking, increasing ecchymosis/hardness at sight, to head to the ED for further evaluation. Patient to follow-up with Dr. Estanislado Pandy for 2 week post-procedure check on Thursday 06/05/2019. All questions answered and concerns addressed. Patient conveys understanding and agrees with plan.   Bea Graff Jayni Prescher, PA-C 06/03/2019, 2:59 PM

## 2019-06-03 NOTE — Telephone Encounter (Signed)
NIR.  Patient with history of left ICA stenosis s/p endovascular revascularization using stent assisted angioplasty 05/22/2019 by Dr. Estanislado Pandy.  Received message from patient complaining of "elongated lump in right groin near access site".  Called patient at [phone]. Patient states that he noticed an elongated lump in his right groin near access site yesterday during a shower. Denies pain to palpation or pain when taking step (climbing stairs) of elongated lump in right groin. States that he has surrounding yellow/brown bruising that has been present since procedure. Denies any new bruising in area.  Discussed case with Dr. Estanislado Pandy who recommends patient come in to [facility] today for evaluation. Anderson Malta (scheduler) to call patient to schedule him for evaluation today.   Bea Graff Keiran Sias, PA-C 06/03/2019, 11:36 AM

## 2019-06-05 ENCOUNTER — Ambulatory Visit (HOSPITAL_COMMUNITY)
Admission: RE | Admit: 2019-06-05 | Discharge: 2019-06-05 | Disposition: A | Discharge status: Home / Self Care | Payer: BC Managed Care – PPO | Source: Ambulatory Visit | Attending: Student | Admitting: Student

## 2019-06-05 ENCOUNTER — Other Ambulatory Visit (HOSPITAL_COMMUNITY): Payer: Self-pay | Admitting: Interventional Radiology

## 2019-06-05 ENCOUNTER — Other Ambulatory Visit: Payer: Self-pay

## 2019-06-05 DIAGNOSIS — R1909 Other intra-abdominal and pelvic swelling, mass and lump: Secondary | ICD-10-CM

## 2019-06-05 DIAGNOSIS — I6522 Occlusion and stenosis of left carotid artery: Secondary | ICD-10-CM | POA: Diagnosis not present

## 2019-06-05 DIAGNOSIS — I729 Aneurysm of unspecified site: Secondary | ICD-10-CM

## 2019-06-05 NOTE — Progress Notes (Signed)
Chief Complaint: Patient was seen today for 2 week follow up of image guided cerebral angiogram with revascularization of the left ICA stenosis using stent assisted angioplasty via right femoral approach by Dr. Estanislado Pandy on 05/22/19  Supervising Physician: Luanne Bras  Patient Status: [facility] - Out-pt  History of Present Illness: Henry Bridges is a 68 y.o. male with a past medical history significant for arthritis, GERD, gout, tongue cancer, stroke, HTN, hypothyroidism and 70% left ICA stenosis s/p revascularization using stent assisted angioplasty on 05/22/19 by Dr. Estanislado Pandy who presents today for a routine 2 week post procedure follow up. Patient was seen by Namon Cirri, PA-C on 06/03/19 due to patient reporting an "elongated lump in the right groin near access site" which was determined to likely be fibrous tissue.   Patient reports today that he feels very good, has not complaints. He denies numbness, tingling, vision changes, bleeding, bruising, headaches, bruising or bleeding. He states he has been compliant with his Brilinta 90 mg BID + ASA 81 mg QD. He states that the lump in his groin is unchanged from 2 days ago - it is not painful, does not limit his mobility, has a slight yellow tint over the skin which he believes is due the bruise resolving. He is hopeful that he can go back to regular activity soon as his brother was unfortunately diagnosed with leukemia and he is planning to be a caregiver for him this fall/winter once he begins treatment.  Past Medical History:  Diagnosis Date   Arthritis    Diabetes mellitus without complication (HCC)    GERD (gastroesophageal reflux disease)    Gout    Hypertension    Hypothyroidism    PONV (postoperative nausea and vomiting)    Stroke (HCC)    TIA   Tongue cancer (HCC)     Past Surgical History:  Procedure Laterality Date   bone spur     CYST EXCISION     on tailbone   FOOT SURGERY     lump of veins that was  removed   IR ANGIO INTRA EXTRACRAN SEL COM CAROTID INNOMINATE BILAT MOD SED  05/12/2019   IR ANGIO VERTEBRAL SEL VERTEBRAL BILAT MOD SED  05/12/2019   IR ANGIOGRAM EXTREMITY LEFT  05/12/2019   IR INTRAVSC STENT CERV CAROTID W/EMB-PROT MOD SED INCL ANGIO  05/22/2019   IR US GUIDE VASC ACCESS RIGHT  05/12/2019   RADIOLOGY WITH ANESTHESIA N/A 05/22/2019   Procedure: RADIOLOGY WITH ANESTHESIA;  Surgeon: Luanne Bras, MD;  Location: [place];  Service: Radiology;  Laterality: N/A;   SHOULDER ARTHROSCOPY Bilateral    to clean out bone spurs   TONGUE SURGERY      Allergies: Aspirin  Medications: Prior to Admission medications   Medication Sig Start Date End Date Taking? Authorizing Provider  amLODipine (NORVASC) 10 MG tablet Take 10 mg by mouth daily.    [provider]  aspirin EC 81 MG tablet Take 81 mg by mouth daily.    [provider]  atorvastatin (LIPITOR) 20 MG tablet Take 2 tablets (40 mg total) by mouth daily at 6 PM. Patient not taking: Reported on 05/12/2019 05/09/19 08/07/19  Barb Merino, MD  atorvastatin (LIPITOR) 40 MG tablet Take 40 mg by mouth daily at 6 PM.    [provider]  doxazosin (CARDURA) 2 MG tablet Take 2 mg by mouth daily.    [provider]  hydrochlorothiazide (HYDRODIURIL) 25 MG tablet Take 25 mg by mouth daily.  [provider]  lansoprazole (PREVACID) 30 MG capsule Take 30 mg by mouth daily at 12 noon.    [provider]  levothyroxine (SYNTHROID) 125 MCG tablet Take 125 mcg by mouth daily before breakfast.    [provider]  losartan (COZAAR) 100 MG tablet Take 100 mg by mouth daily.    [provider]  metFORMIN (GLUCOPHAGE) 1000 MG tablet Take 1,000 mg by mouth 2 (two) times daily with a meal.    [provider]  Omega-3 Fatty Acids (FISH OIL) 1000 MG CPDR Take 1,000-2,000 mg by mouth See admin instructions. Take 2 capsules in the morning, and 1 capsules in the evening     [provider]  ticagrelor (BRILINTA) 90 MG TABS tablet Take by mouth 2 (two) times daily.    [provider]     Family History  Problem Relation Age of Onset   Parkinson's disease Mother    Heart disease Father     Social History   Socioeconomic History   Marital status: Married    Spouse name: Not on file   Number of children: Not on file   Years of education: Not on file   Highest education level: Not on file  Occupational History   Not on file  Social Needs   Financial resource strain: Not on file   Food insecurity    Worry: Not on file    Inability: Not on file   Transportation needs    Medical: Not on file    Non-medical: Not on file  Tobacco Use   Smoking status: Never Smoker   Smokeless tobacco: Never Used  Substance and Sexual Activity   Alcohol use: Not Currently   Drug use: Never   Sexual activity: Not on file  Lifestyle   Physical activity    Days per week: Not on file    Minutes per session: Not on file   Stress: Not on file  Relationships   Social connections    Talks on phone: Not on file    Gets together: Not on file    Attends religious service: Not on file    Active member of club or organization: Not on file    Attends meetings of clubs or organizations: Not on file    Relationship status: Not on file  Other Topics Concern   Not on file  Social History Narrative   Not on file     Review of Systems: A 12 point ROS discussed and pertinent positives are indicated in the HPI above.  All other systems are negative.  Review of Systems  Constitutional: Negative for appetite change, chills and fever.  HENT: Negative for hearing loss, nosebleeds, tinnitus and trouble swallowing.   Eyes: Negative for photophobia, pain, redness and visual disturbance.  Respiratory: Negative for cough and shortness of breath.   Cardiovascular: Negative for chest pain and leg swelling.  Gastrointestinal: Negative for  abdominal pain, blood in stool, diarrhea, nausea and vomiting.  Genitourinary: Negative for dysuria and hematuria.  Musculoskeletal: Negative for back pain and gait problem.  Skin: Negative for rash and wound.  Neurological: Negative for dizziness, seizures, syncope, facial asymmetry, speech difficulty, weakness, light-headedness, numbness and headaches.  Hematological: Does not bruise/bleed easily.  Psychiatric/Behavioral: Negative for confusion.    Vital Signs: There were no vitals taken for this visit.  Physical Exam Constitutional:      General: He is not in acute distress. HENT:     Head: Normocephalic.  Cardiovascular:  Comments: (+) right groin pulsatile area of firmness approximately 3 cm in length at the site of previous puncture. No pain on light - moderate palpation, 1/10 pain on deep palpation. No bruising, erythema, edema, rash, open wound or bleeding.  Pulmonary:     Effort: Pulmonary effort is normal.  Abdominal:     General: There is no distension.     Palpations: Abdomen is soft.  Skin:    General: Skin is warm and dry.  Neurological:     Mental Status: He is alert. Mental status is at baseline.     Cranial Nerves: No cranial nerve deficit.  Psychiatric:        Mood and Affect: Mood normal.        Behavior: Behavior normal.        Thought Content: Thought content normal.        Judgment: Judgment normal.      Imaging: Ct Angio Head W Or Wo Contrast  Result Date: 05/08/2019 CLINICAL DATA:  TIA EXAM: CT ANGIOGRAPHY HEAD AND NECK TECHNIQUE: Multidetector CT imaging of the head and neck was performed using the standard protocol during bolus administration of intravenous contrast. Multiplanar CT image reconstructions and MIPs were obtained to evaluate the vascular anatomy. Carotid stenosis measurements (when applicable) are obtained utilizing NASCET criteria, using the distal internal carotid diameter as the denominator. CONTRAST:  164m OMNIPAQUE IOHEXOL 350  MG/ML SOLN COMPARISON:  MRI head 05/08/2019, CT head 05/07/2019 FINDINGS: CTA NECK FINDINGS Aortic arch: 4 vessel arch. Left vertebral artery origin from the arch. No significant atherosclerotic disease dissection or aneurysm in the aortic arch. Proximal great vessels widely patent. Right carotid system: Right carotid widely patent. Minimal atherosclerotic disease right bifurcation Left carotid system: Left common carotid artery widely patent. Atherosclerotic disease in the left carotid bulb and proximal left internal carotid artery. 50% diameter stenosis of the left internal carotid artery above the bulb. Vertebral arteries: Left vertebral non dominant with origin from the arch. Both vertebral arteries are patent the basilar without significant stenosis. Skeleton: No acute skeletal abnormality. Other neck: Negative for mass or adenopathy Upper chest: Lung apices clear bilaterally. Review of the MIP images confirms the above findings CTA HEAD FINDINGS Anterior circulation: Cavernous carotid widely patent bilaterally without stenosis. Anterior and middle cerebral arteries widely patent bilaterally. Posterior circulation: Both vertebral arteries patent to the basilar. PICA patent bilaterally. Basilar widely patent. Superior cerebellar and posterior cerebral arteries and anterior inferior cerebellar arteries patent bilaterally. Fetal origin right posterior cerebral artery. Venous sinuses: Patent Negative for aneurysm. Review of the MIP images confirms the above findings IMPRESSION: 1. 50% diameter stenosis proximal left internal carotid artery 2. Right carotid artery widely patent. Both vertebral arteries widely patent. 3. Negative for intracranial large vessel occlusion or significant stenosis. Electronically Signed   By: CFranchot GalloM.D.   On: 05/08/2019 19:17   Ct Angio Neck W Or Wo Contrast  Result Date: 05/08/2019 CLINICAL DATA:  TIA EXAM: CT ANGIOGRAPHY HEAD AND NECK TECHNIQUE: Multidetector CT imaging of  the head and neck was performed using the standard protocol during bolus administration of intravenous contrast. Multiplanar CT image reconstructions and MIPs were obtained to evaluate the vascular anatomy. Carotid stenosis measurements (when applicable) are obtained utilizing NASCET criteria, using the distal internal carotid diameter as the denominator. CONTRAST:  1040mOMNIPAQUE IOHEXOL 350 MG/ML SOLN COMPARISON:  MRI head 05/08/2019, CT head 05/07/2019 FINDINGS: CTA NECK FINDINGS Aortic arch: 4 vessel arch. Left vertebral artery origin from the arch. No  significant atherosclerotic disease dissection or aneurysm in the aortic arch. Proximal great vessels widely patent. Right carotid system: Right carotid widely patent. Minimal atherosclerotic disease right bifurcation Left carotid system: Left common carotid artery widely patent. Atherosclerotic disease in the left carotid bulb and proximal left internal carotid artery. 50% diameter stenosis of the left internal carotid artery above the bulb. Vertebral arteries: Left vertebral non dominant with origin from the arch. Both vertebral arteries are patent the basilar without significant stenosis. Skeleton: No acute skeletal abnormality. Other neck: Negative for mass or adenopathy Upper chest: Lung apices clear bilaterally. Review of the MIP images confirms the above findings CTA HEAD FINDINGS Anterior circulation: Cavernous carotid widely patent bilaterally without stenosis. Anterior and middle cerebral arteries widely patent bilaterally. Posterior circulation: Both vertebral arteries patent to the basilar. PICA patent bilaterally. Basilar widely patent. Superior cerebellar and posterior cerebral arteries and anterior inferior cerebellar arteries patent bilaterally. Fetal origin right posterior cerebral artery. Venous sinuses: Patent Negative for aneurysm. Review of the MIP images confirms the above findings IMPRESSION: 1. 50% diameter stenosis proximal left internal  carotid artery 2. Right carotid artery widely patent. Both vertebral arteries widely patent. 3. Negative for intracranial large vessel occlusion or significant stenosis. Electronically Signed   By: Franchot Gallo M.D.   On: 05/08/2019 19:17   [name] Contrast  Result Date: 05/08/2019 CLINICAL DATA:  68 y/o M; headache, blurred vision, right arm weakness. TIA, initial exam. History of tongue cancer post resection and radiation. EXAM: MRI HEAD WITHOUT CONTRAST TECHNIQUE: Multiplanar, multiecho pulse sequences of the brain and surrounding structures were obtained without intravenous contrast. COMPARISON:  05/07/2019 CT head. FINDINGS: Brain: Mildly increased diffusion signal and mildly decreased ADC of the left caudate nucleus head and body on the axial diffusion sequence (series 8, image 30) and to lesser degree on the coronal diffusion sequence (series 1210, image 22). No associated hemorrhage or mass effect. On the axial DWI sequence there is also increased signal within the left anterior cingulate gyrus, right insula, and right hippocampus, however, this is not replicated on the coronal sequences and may be artifactual. No additional corresponding signal abnormality on other sequences. No acute hemorrhage, hydrocephalus, extra-axial collection, or mass effect. Scattered punctate nonspecific T2 FLAIR hyperintensities in subcortical and periventricular white matter are compatible with mild chronic microvascular ischemic changes. Mild volume loss of the brain. Vascular: Normal flow voids. Skull and upper cervical spine: Normal marrow signal. Sinuses/Orbits: Negative. Other: None. IMPRESSION: Mildly decreased diffusion of the left caudate nucleus. Findings may represent acute/early subacute infarction, however, the distribution is somewhat atypical for lacunar infarction raising the question of seizure related activity or metabolic/toxic injury. Electronically Signed   By: Kristine Garbe M.D.   On:  05/08/2019 00:24   Mr Jeri Cos BS Contrast  Result Date: 05/08/2019 CLINICAL DATA:  TIA.  Hand weakness.  History of tongue cancer EXAM: MRI HEAD WITHOUT AND WITH CONTRAST TECHNIQUE: Multiplanar, multiecho pulse sequences of the brain and surrounding structures were obtained without and with intravenous contrast. CONTRAST:  10 mL Gadovist IV COMPARISON:  MRI head 05/07/2019 FINDINGS: Brain: Negative for acute infarct. Previously noted signal in the left caudate on axial diffusion imaging no longer present and likely was artifact. Different machines utilized on the 2 studies. Ventricle size normal. Scattered small white matter hyperintensities bilaterally appear chronic. Negative for hemorrhage mass or fluid collection. No midline shift. Normal enhancement. No evidence of metastatic disease. No abnormal enhancement in the left caudate region. Vascular: Normal arterial  flow voids Skull and upper cervical spine: Negative Sinuses/Orbits: Negative Other: None IMPRESSION: No acute abnormality Diffusion-weighted signal left caudate on yesterday's study appears to be artifactual. Mild chronic white matter changes likely due to microvascular ischemia. Electronically Signed   By: Franchot Gallo M.D.   On: 05/08/2019 13:16   Ir Angiogram Extremity Left  Result Date: 05/13/2019 CLINICAL DATA:  History of left sided amaurosis fugax, and of transient right-sided paresthesias. CT angiogram of the head and neck revealing significant stenosis of the proximal left internal carotid artery. EXAM: IR ANGIO VERTEBRAL SEL SUBCLAVIAN INNOMINATE UNI LEFT MOD SED; LEFT EXTREMITY ARTERIOGRAPHY; IR ULTRASOUND GUIDANCE VASC ACCESS RIGHT; BILATERAL COMMON CAROTID AND INNOMINATE ANGIOGRAPHY; IR ANGIO VERTEBRAL SEL VERTEBRAL UNI RIGHT MOD SED COMPARISON:  CT angiogram of the head and neck of 05/08/2019. MEDICATIONS: Heparin 2000 units intra-arterially. No antibiotic was administered within 1 hour of the procedure. ANESTHESIA/SEDATION:  Versed 1 mg IV; Fentanyl 25 mcg IV. Moderate Sedation Time:  35 minutes minutes. The patient was continuously monitored during the procedure by the interventional radiology nurse under my direct supervision. CONTRAST:  Isovue 300 approximately 70 mm. FLUOROSCOPY TIME:  Fluoroscopy Time: 18 minutes 36 seconds (867 mGy). COMPLICATIONS: None immediate. TECHNIQUE: Informed written consent was obtained from the patient after a thorough discussion of the procedural risks, benefits and alternatives. All questions were addressed. Maximal Sterile Barrier Technique was utilized including caps, mask, sterile gowns, sterile gloves, sterile drape, hand hygiene and skin antiseptic. A timeout was performed prior to the initiation of the procedure. The right forearm to the wrist was prepped and draped in the usual sterile fashion. The right radial artery was then identified, and a dorsal palmar anastomosis verified. The right radial artery morphology was then evaluated and documented with ultrasound imaging. Using ultrasound guidance transradial access into the right radial artery was then obtained with a micropuncture needle. Over a 0.018 inch micro guidewire, a 4/5 French radial sheath was then advanced into the right radial artery without difficulty. Good aspiration was obtained from the side port of the radial sheath. A cocktail of 2000 units of heparin, 2.5 mg of verapamil, and 200 mcg of nitroglycerin were then injected in a diluted form without difficulty. A right radial angiogram was obtained. Over a 0.035 inch Roadrunner guidewire, a 5 [place] Simmons 2 diagnostic catheter was then advanced to the aortic arch region, and formed without difficulty. Selective cannulations were then performed with the right common carotid artery, the left common carotid artery, the left subclavian artery, the left vertebral artery, and the right vertebral artery. Following the procedure, the diagnostic catheter was removed. The right radial  sheath was then removed with the application of the wrist band for hemostasis. The distal right radial pulse was then verified at the end of the procedure. FINDINGS: The right common carotid arteriogram demonstrates the right external carotid artery and its major branches to be widely patent. The right internal carotid artery at the bulb to the cranial skull base demonstrates wide patency. The petrous, cavernous and supraclinoid segments are widely patent. Right posterior communicating artery is seen opacifying the right posterior cerebral distribution. The right middle cerebral artery and the right anterior cerebral artery opacify into the capillary and venous phases. The left common carotid arteriogram demonstrates the left external carotid artery and its major branches to be widely patent. The left internal carotid artery just distal to the bulb has a severe tapered narrowing of approximately 70% by the NASCET criteria. Distal to this the vessel then  resumes normal caliber in its distal cervical segment. The petrous, cavernous and the supraclinoid segments are widely patent. The left middle cerebral artery and the left anterior cerebral artery opacify into the capillary and venous phases. A small infundibulum is noted at the origin of the left posterior-inferior cerebellar artery. The left middle cerebral artery and the left anterior cerebral artery opacify into the capillary and venous phases. The left subclavian arteriogram demonstrates ascending cervical branch of the left thyrocervical trunk. This ascends to the level of C2. The left vertebral artery origin is between the origins of the left subclavian artery, and the left common carotid artery. The vessel is seen to meander to the cranial skull base. Opacification is seen of the left vertebrobasilar junction and the left posterior-inferior cerebellar artery. On the lateral projection, the opacified portion of the basilar artery, the superior cerebellar  arteries, the posterior cerebral artery on the left side, and of the anterior-inferior cerebellar arteries is grossly intact into the delayed arterial phase. Unopacified blood is seen in the basilar artery from the contralateral vertebral artery. The dominant right vertebral artery origin is widely patent. The vessel is seen to opacify to the cranial skull base. Wide patency is seen of the right posterior-inferior cerebellar artery and the right vertebrobasilar junction. The basilar artery, the left posterior cerebral artery, the superior cerebellar arteries and anterior inferior cerebellar arteries opacify into the capillary and venous phases. Nonvisualization of the right posterior cerebral artery is related to dominant right PCOM opacifying from the right internal carotid artery. 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 PLAN: Findings were reviewed with the patient. They understand the symptomatic nature of the left internal carotid artery severe stenosis given his recent symptoms. In order to prevent further left cerebral hemispheric ischemic events related to this stenosis, endovascular revascularization with distal protection was discussed. The procedure was discussed with the patient. This would be with distal protection with a risk of 1-2% chance of an ischemic stroke. Very remote possibility of reperfusion hemorrhage was also reviewed. The patient expressed understanding and wanted to proceed with endovascular revascularization of the symptomatic stenosis of the left internal carotid artery. Electronically Signed   By: Luanne Bras M.D.   On: 05/12/2019 13:20   Ir Sherre Lain Stent Cerv Carotid W/emb-prot Mod Sed  Result Date: 05/26/2019 CLINICAL DATA:  Symptomatic left internal carotid artery proximal stenosis. EXAM: INTRAVSC STENT CERV CAROTID W/ EMB-PROT COMPARISON:  Diagnostic arteriogram of May 12, 2019.  MEDICATIONS: Heparin 4,500 units IV; Ancef 2 g IV antibiotic was administered within 1 hour of the procedure. ANESTHESIA/SEDATION: Mac anesthesia as per the Department of Anesthesiology at [place]:  Isovue 300 approximately 60 mL. FLUOROSCOPY TIME:  Fluoroscopy Time: 27 minutes 24 seconds (1040 mGy). COMPLICATIONS: None immediate. TECHNIQUE: Informed written consent was obtained from the patient after a thorough discussion of the procedural risks, benefits and alternatives. All questions were addressed. Maximal Sterile Barrier Technique was utilized including caps, mask, sterile gowns, sterile gloves, sterile drape, hand hygiene and skin antiseptic. A timeout was performed prior to the initiation of the procedure. The right groin was prepped and draped in the usual sterile fashion. Thereafter using modified Seldinger technique, transfemoral access into the right common femoral artery was obtained without difficulty. Over a 0.035 inch guidewire, a 5 French Pinnacle sheath was inserted. Through this, and also over 0.035 inch guidewire, a 5 [place] JB 1 catheter was advanced to  the aortic arch region and selectively positioned in the left common carotid artery. FINDINGS: The left common carotid arteriogram demonstrates the left external carotid artery and its major branches to be widely patent. The left internal carotid artery just distal to the bulb again demonstrates a segmental focal area of severe stenosis extending distal to the left internal carotid artery bulb. Distal to this the vessel assumes normal caliber. The petrous, cavernous and the supraclinoid segments demonstrate wide patency. The left middle cerebral artery and the left anterior cerebral artery opacify into the capillary and venous phases. Cross-filling via the anterior communicating artery of the right anterior cerebral artery A2 segment and distally is seen. Endovascular stent assisted angioplasty of symptomatic left internal  carotid artery proximally with distal protection. The diagnostic JB 1 catheter in the left common carotid artery was then exchanged over a 0.035 inch 300 cm Rosen exchange guidewire for a 6 French 80 cm Cook shuttle sheath using biplane roadmap technique and constant fluoroscopic guidance. The exchange guidewire was removed. Good aspiration was obtained from the hub of the [facility] shuttle sheath. This was then connected to continuous heparinized saline infusion. Measurements were performed of the left internal carotid artery in its distal vertical segment, and also the left internal carotid artery in its most normal segment distal to the diseased segment, and the left common carotid artery distally. A 4/7 mm Emboshield distal protection device was then prepped in heparinized saline infusion in its housing. The filter was cleared of the air bubbles and retrieved into the filter delivery catheter. The combination of the 014 inch wire, and the filter delivery catheter was then retrieved from its housing. A J configuration was given to the 014 inch micro guidewire. Thereafter, the combination of the micro guidewire and the delivery filter microcatheter were advanced to the distal end of the [facility] shuttle sheath. Using a torque device the micro guidewire was then directed across the tight stenosis and into the horizontal petrous cavernous segment with the filter delivery catheter. The Embosh[place] filter was then deployed in the usual manner without difficulty. The delivery microcatheter was then gently retrieved under constant fluoroscopic guidance as the delivery micro guidewire was maintained in the horizontal petrous segment. A control arteriogram performed through the [facility] shuttle sheath following the delivery of the filter demonstrated no evidence of spasms or dissections or of an intracranial filling defects. An Xact 8-6 mm x 40 mm stent was then prepped retrogradely with heparinized saline infusion. Using the rapid  exchange technique, the stent with its delivery catheter were then advanced under constant fluoroscopic guidance using the rapid exchange technique to the left internal carotid proximally. The proximal and the distal landing zones were defined. The stent was then deployed without any complications. The stent delivery apparatus was then retrieved and removed. Control arteriograms performed through the [facility] shuttle sheath in the left common carotid artery demonstrated excellent apposition of the stent distally in the left internal carotid artery, and proximally in the left common carotid artery. There continued to be a waist at the site of the previously noted tight stenosis. This prompted stent angioplasty with a 5 mm x 30 mm Viatrac 014 balloon angioplasty catheter. This after having been prepped was again advanced to the stent using the rapid exchange technique. The balloon was then gently inflated using a micro inflation syringe device via micro tubing to just over 5 mm where it was maintained for approximately 30 seconds. At this time no evidence of hypotension or bradycardia was seen.  The balloon was then deflated and retrieved and removed. A control arteriogram performed through the [facility] shuttle sheath demonstrated excellent apposition and relief of the waist post angioplasty. A control arteriogram performed distally demonstrated no evidence of intraluminal filling defects or of occlusion in the anterior circulation intracranially. Filter capture device was then advanced again using the rapid exchange technique to the proximal end of the filter. The filter was then captured into the filter capture catheter by retrieving the delivery micro guidewire. The combination was then retrieved and removed without any complications. Control arteriograms were then performed at 15 and 35 minutes post angioplasty within the stent. These continued to demonstrate no evidence of intraluminal filling defects or of intracranial  abnormalities. During this time the patient was given about 1.5 mg of Integrilin to abort any intrastent platelet aggregation. None was seen. A final control arteriogram performed through the [place] sheath in the left common carotid artery continued to demonstrate excellent flow through the stented segment of the proximal left internal carotid artery, without any evidence angiographically of intraluminal filling defects or of occlusions. The [place] shuttle sheath was then removed and replaced with a 6 [place] Pinnacle sheath which in turn was then removed with the successful application of a 6 [place] Angio-Seal closure device. The right groin appeared soft without evidence of hematoma or bleeding. Distal pulses remained palpable in the dorsalis pedis arteries bilaterally, and Dopplerable posterior tibial arteries bilaterally unchanged from prior to the procedure. Neurologically the patient remained stable without any new evidence of headaches, nausea, vomiting, visual changes, speech aberrations, or motor weakness. The patient was then transferred to the neuro ICU to continue on low-dose IV heparin, and close monitoring of patient's blood pressure and neurological status. His stay overnight was unremarkable. The following morning, the IV heparin was stopped and patient switched to aspirin 81 mg a day, and Brilinta 90 mg a day. He sat in a chair and ambulated without difficulty. Patient was then discharged under the care of his wife. Special instructions were given in regards to maintaining adequate hydration and to avoid stooping, bending or lifting weights above 20 pounds for about a couple of weeks. He was asked to refrain from smoking also. He was advised to call 911 should he have severe headaches, nausea, vomiting, or facial weakness, speech aberrations or experiencing visual symptoms such as amaurosis fugax and blindness. Patient expressed understanding and agreed to the above management plan. 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 PLAN: Follow-up in the clinic 2 to 3 weeks post discharge. Electronically Signed   By: Luanne Bras M.D.   On: 05/23/2019 16:07   Ir US Guide Vasc Access Right  Result Date: 05/13/2019 CLINICAL DATA:  History of left sided amaurosis fugax, and of transient right-sided paresthesias. CT angiogram of the head and neck revealing significant stenosis of the proximal left internal carotid artery. EXAM: IR ANGIO VERTEBRAL SEL SUBCLAVIAN INNOMINATE UNI LEFT MOD SED; LEFT EXTREMITY ARTERIOGRAPHY; IR ULTRASOUND GUIDANCE VASC ACCESS RIGHT; BILATERAL COMMON CAROTID AND INNOMINATE ANGIOGRAPHY; IR ANGIO VERTEBRAL SEL VERTEBRAL UNI RIGHT MOD SED COMPARISON:  CT angiogram of the head and neck of 05/08/2019. MEDICATIONS: Heparin 2000 units intra-arterially. No antibiotic was administered within 1 hour of the procedure. ANESTHESIA/SEDATION: Versed 1 mg IV; Fentanyl 25 mcg IV. Moderate Sedation Time:  35 minutes minutes. The patient was continuously monitored during the procedure by the interventional radiology nurse under my direct supervision. CONTRAST:  Isovue 300 approximately 70 mm. FLUOROSCOPY TIME:  Fluoroscopy Time: 18 minutes 36 seconds (867 mGy). COMPLICATIONS: None immediate. TECHNIQUE: Informed written consent was obtained from the patient after a thorough discussion of the procedural risks, benefits and alternatives. All questions were addressed. Maximal Sterile Barrier Technique was utilized including caps, mask, sterile gowns, sterile gloves, sterile drape, hand hygiene and skin antiseptic. A timeout was performed prior to the initiation of the procedure. The right forearm to the wrist was prepped and draped in the usual sterile fashion. The right radial artery was then identified, and a dorsal palmar anastomosis verified. The right radial artery morphology was  then evaluated and documented with ultrasound imaging. Using ultrasound guidance transradial access into the right radial artery was then obtained with a micropuncture needle. Over a 0.018 inch micro guidewire, a 4/5 French radial sheath was then advanced into the right radial artery without difficulty. Good aspiration was obtained from the side port of the radial sheath. A cocktail of 2000 units of heparin, 2.5 mg of verapamil, and 200 mcg of nitroglycerin were then injected in a diluted form without difficulty. A right radial angiogram was obtained. Over a 0.035 inch Roadrunner guidewire, a 5 [place] Simmons 2 diagnostic catheter was then advanced to the aortic arch region, and formed without difficulty. Selective cannulations were then performed with the right common carotid artery, the left common carotid artery, the left subclavian artery, the left vertebral artery, and the right vertebral artery. Following the procedure, the diagnostic catheter was removed. The right radial sheath was then removed with the application of the wrist band for hemostasis. The distal right radial pulse was then verified at the end of the procedure. FINDINGS: The right common carotid arteriogram demonstrates the right external carotid artery and its major branches to be widely patent. The right internal carotid artery at the bulb to the cranial skull base demonstrates wide patency. The petrous, cavernous and supraclinoid segments are widely patent. Right posterior communicating artery is seen opacifying the right posterior cerebral distribution. The right middle cerebral artery and the right anterior cerebral artery opacify into the capillary and venous phases. The left common carotid arteriogram demonstrates the left external carotid artery and its major branches to be widely patent. The left internal carotid artery just distal to the bulb has a severe tapered narrowing of approximately 70% by the NASCET criteria. Distal to this the  vessel then resumes normal caliber in its distal cervical segment. The petrous, cavernous and the supraclinoid segments are widely patent. The left middle cerebral artery and the left anterior cerebral artery opacify into the capillary and venous phases. A small infundibulum is noted at the origin of the left posterior-inferior cerebellar artery. The left middle cerebral artery and the left anterior cerebral artery opacify into the capillary and venous phases. The left subclavian arteriogram demonstrates ascending cervical branch of the left thyrocervical trunk. This ascends to the level of C2. The left vertebral artery origin is between the origins of the left subclavian artery, and the left common carotid artery. The vessel is seen to meander to the cranial skull base. Opacification is seen of the left vertebrobasilar junction and the left posterior-inferior cerebellar artery. On the lateral projection, the opacified portion of the basilar artery, the superior cerebellar arteries, the posterior cerebral artery on the left side, and of the anterior-inferior cerebellar arteries is grossly intact into the delayed arterial phase. Unopacified blood is seen in the basilar artery from the contralateral vertebral artery. The dominant right vertebral artery  origin is widely patent. The vessel is seen to opacify to the cranial skull base. Wide patency is seen of the right posterior-inferior cerebellar artery and the right vertebrobasilar junction. The basilar artery, the left posterior cerebral artery, the superior cerebellar arteries and anterior inferior cerebellar arteries opacify into the capillary and venous phases. Nonvisualization of the right posterior cerebral artery is related to dominant right PCOM opacifying from the right internal carotid artery. 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 PLAN: Findings were reviewed with  the patient. They understand the symptomatic nature of the left internal carotid artery severe stenosis given his recent symptoms. In order to prevent further left cerebral hemispheric ischemic events related to this stenosis, endovascular revascularization with distal protection was discussed. The procedure was discussed with the patient. This would be with distal protection with a risk of 1-2% chance of an ischemic stroke. Very remote possibility of reperfusion hemorrhage was also reviewed. The patient expressed understanding and wanted to proceed with endovascular revascularization of the symptomatic stenosis of the left internal carotid artery. Electronically Signed   By: Luanne Bras M.D.   On: 05/12/2019 13:20   Ct Head Code Stroke Wo Contrast  Result Date: 05/07/2019 CLINICAL DATA:  Code stroke. Headache above the left eye, blurred vision, and right hand weakness lasting 15-30 minutes. EXAM: CT HEAD WITHOUT CONTRAST TECHNIQUE: Contiguous axial images were obtained from the base of the skull through the vertex without intravenous contrast. COMPARISON:  None. FINDINGS: Brain: There is no evidence of acute infarct, intracranial hemorrhage, mass, midline shift, or extra-axial fluid collection. The ventricles and sulci are within normal limits for age. Patchy hypodensities in the cerebral white matter bilaterally are nonspecific but compatible with mild chronic small vessel ischemic disease. Vascular: No hyperdense vessel. Skull: No fracture or suspicious osseous lesion. Sinuses/Orbits: Visualized paranasal sinuses and mastoid air cells are clear. Visualized orbits are unremarkable. Other: None. ASPECTS [facility] Stroke Program Early CT Score) - Ganglionic level infarction (caudate, lentiform nuclei, internal capsule, insula, M1-M3 cortex): 7 - Supraganglionic infarction (M4-M6 cortex): 3 Total score (0-10 with 10 being normal): 10 IMPRESSION: 1. No evidence of acute intracranial abnormality. 2. ASPECTS  is 10. 3. Mild chronic small vessel ischemic disease. These results were called by telephone at the time of interpretation on 05/07/2019 at 1:00 pm to Dr. Aletta Edouard , who verbally acknowledged these results. Electronically Signed   By: Logan Bores M.D.   On: 05/07/2019 13:01   Vas US Carotid (at [facility] Only)  Result Date: 05/10/2019 Carotid Arterial Duplex Study Indications:       TIA. Risk Factors:      Hypertension. Comparison Study:  no prior Performing Technologist: Abram Sander RVS  Examination Guidelines: A complete evaluation includes B-mode imaging, spectral Doppler, color Doppler, and power Doppler as needed of all accessible portions of each vessel. Bilateral testing is considered an integral part of a complete examination. Limited examinations for reoccurring indications may be performed as noted.  Right Carotid Findings: +----------+--------+--------+--------+------------+--------+             PSV cm/s EDV cm/s Stenosis Describe     Comments  +----------+--------+--------+--------+------------+--------+  CCA Prox   85       16                heterogenous           +----------+--------+--------+--------+------------+--------+  CCA Distal 75  23                heterogenous           +----------+--------+--------+--------+------------+--------+  ICA Prox   81       27       1-39%    heterogenous           +----------+--------+--------+--------+------------+--------+  ICA Distal 63       22                                       +----------+--------+--------+--------+------------+--------+  ECA        72       7                                        +----------+--------+--------+--------+------------+--------+ +----------+--------+-------+--------+-------------------+             PSV cm/s EDV cms Describe Arm Pressure (mmHG)  +----------+--------+-------+--------+-------------------+  Subclavian 100                                             +----------+--------+-------+--------+-------------------+ +---------+--------+--+--------+--+---------+  Vertebral PSV cm/s 37 EDV cm/s 10 Antegrade  +---------+--------+--+--------+--+---------+  Left Carotid Findings: +----------+--------+--------+--------+------------+--------+             PSV cm/s EDV cm/s Stenosis Describe     Comments  +----------+--------+--------+--------+------------+--------+  CCA Prox   73       16                                       +----------+--------+--------+--------+------------+--------+  CCA Distal 67       16                heterogenous           +----------+--------+--------+--------+------------+--------+  ICA Prox   237      83       60-79%   heterogenous           +----------+--------+--------+--------+------------+--------+  ICA Mid    166      61                                       +----------+--------+--------+--------+------------+--------+  ICA Distal 107      23                                       +----------+--------+--------+--------+------------+--------+  ECA        96       14                                       +----------+--------+--------+--------+------------+--------+ +----------+--------+--------+--------+-------------------+  Subclavian PSV cm/s EDV cm/s Describe Arm Pressure (mmHG)  +----------+--------+--------+--------+-------------------+             104                                             +----------+--------+--------+--------+-------------------+ +---------+--------+--+--------+-+---------+  Vertebral PSV cm/s 38 EDV cm/s 6 Antegrade  +---------+--------+--+--------+-+---------+  Summary: Right Carotid: Velocities in the right ICA are consistent with a 1-39% stenosis. Left Carotid: Velocities in the left ICA are consistent with a 60-79% stenosis. Vertebrals: Bilateral vertebral arteries demonstrate antegrade flow. *See table(s) above for measurements and observations.  Electronically signed by Antony Contras MD on 05/10/2019 at 11:38:51  AM.    Final    Ir Angio Intra Extracran Sel Com Carotid Innominate Bilat Mod Sed  Result Date: 05/13/2019 CLINICAL DATA:  History of left sided amaurosis fugax, and of transient right-sided paresthesias. CT angiogram of the head and neck revealing significant stenosis of the proximal left internal carotid artery. EXAM: IR ANGIO VERTEBRAL SEL SUBCLAVIAN INNOMINATE UNI LEFT MOD SED; LEFT EXTREMITY ARTERIOGRAPHY; IR ULTRASOUND GUIDANCE VASC ACCESS RIGHT; BILATERAL COMMON CAROTID AND INNOMINATE ANGIOGRAPHY; IR ANGIO VERTEBRAL SEL VERTEBRAL UNI RIGHT MOD SED COMPARISON:  CT angiogram of the head and neck of 05/08/2019. MEDICATIONS: Heparin 2000 units intra-arterially. No antibiotic was administered within 1 hour of the procedure. ANESTHESIA/SEDATION: Versed 1 mg IV; Fentanyl 25 mcg IV. Moderate Sedation Time:  35 minutes minutes. The patient was continuously monitored during the procedure by the interventional radiology nurse under my direct supervision. CONTRAST:  Isovue 300 approximately 70 mm. FLUOROSCOPY TIME:  Fluoroscopy Time: 18 minutes 36 seconds (867 mGy). COMPLICATIONS: None immediate. TECHNIQUE: Informed written consent was obtained from the patient after a thorough discussion of the procedural risks, benefits and alternatives. All questions were addressed. Maximal Sterile Barrier Technique was utilized including caps, mask, sterile gowns, sterile gloves, sterile drape, hand hygiene and skin antiseptic. A timeout was performed prior to the initiation of the procedure. The right forearm to the wrist was prepped and draped in the usual sterile fashion. The right radial artery was then identified, and a dorsal palmar anastomosis verified. The right radial artery morphology was then evaluated and documented with ultrasound imaging. Using ultrasound guidance transradial access into the right radial artery was then obtained with a micropuncture needle. Over a 0.018 inch micro guidewire, a 4/5 French radial  sheath was then advanced into the right radial artery without difficulty. Good aspiration was obtained from the side port of the radial sheath. A cocktail of 2000 units of heparin, 2.5 mg of verapamil, and 200 mcg of nitroglycerin were then injected in a diluted form without difficulty. A right radial angiogram was obtained. Over a 0.035 inch Roadrunner guidewire, a 5 [place] Simmons 2 diagnostic catheter was then advanced to the aortic arch region, and formed without difficulty. Selective cannulations were then performed with the right common carotid artery, the left common carotid artery, the left subclavian artery, the left vertebral artery, and the right vertebral artery. Following the procedure, the diagnostic catheter was removed. The right radial sheath was then removed with the application of the wrist band for hemostasis. The distal right radial pulse was then verified at the end of the procedure. FINDINGS: The right common carotid arteriogram demonstrates the right external carotid artery and its major branches to be widely patent. The right internal carotid artery at the bulb to the cranial skull base demonstrates wide patency. The petrous, cavernous and supraclinoid segments are widely patent. Right posterior communicating artery is seen opacifying the right posterior cerebral distribution. The right middle cerebral artery and the right anterior cerebral artery opacify into the capillary and venous phases. The left common carotid arteriogram demonstrates the left external carotid artery and its major branches to be widely patent. The left internal  carotid artery just distal to the bulb has a severe tapered narrowing of approximately 70% by the NASCET criteria. Distal to this the vessel then resumes normal caliber in its distal cervical segment. The petrous, cavernous and the supraclinoid segments are widely patent. The left middle cerebral artery and the left anterior cerebral artery opacify into the  capillary and venous phases. A small infundibulum is noted at the origin of the left posterior-inferior cerebellar artery. The left middle cerebral artery and the left anterior cerebral artery opacify into the capillary and venous phases. The left subclavian arteriogram demonstrates ascending cervical branch of the left thyrocervical trunk. This ascends to the level of C2. The left vertebral artery origin is between the origins of the left subclavian artery, and the left common carotid artery. The vessel is seen to meander to the cranial skull base. Opacification is seen of the left vertebrobasilar junction and the left posterior-inferior cerebellar artery. On the lateral projection, the opacified portion of the basilar artery, the superior cerebellar arteries, the posterior cerebral artery on the left side, and of the anterior-inferior cerebellar arteries is grossly intact into the delayed arterial phase. Unopacified blood is seen in the basilar artery from the contralateral vertebral artery. The dominant right vertebral artery origin is widely patent. The vessel is seen to opacify to the cranial skull base. Wide patency is seen of the right posterior-inferior cerebellar artery and the right vertebrobasilar junction. The basilar artery, the left posterior cerebral artery, the superior cerebellar arteries and anterior inferior cerebellar arteries opacify into the capillary and venous phases. Nonvisualization of the right posterior cerebral artery is related to dominant right PCOM opacifying from the right internal carotid artery. 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 PLAN: Findings were reviewed with the patient. They understand the symptomatic nature of the left internal carotid artery severe stenosis given his recent symptoms. In order to prevent further left cerebral hemispheric ischemic events related to this stenosis,  endovascular revascularization with distal protection was discussed. The procedure was discussed with the patient. This would be with distal protection with a risk of 1-2% chance of an ischemic stroke. Very remote possibility of reperfusion hemorrhage was also reviewed. The patient expressed understanding and wanted to proceed with endovascular revascularization of the symptomatic stenosis of the left internal carotid artery. Electronically Signed   By: Luanne Bras M.D.   On: 05/12/2019 13:20   Ir Angio Vertebral Sel Vertebral Bilat Mod Sed  Result Date: 05/13/2019 CLINICAL DATA:  History of left sided amaurosis fugax, and of transient right-sided paresthesias. CT angiogram of the head and neck revealing significant stenosis of the proximal left internal carotid artery. EXAM: IR ANGIO VERTEBRAL SEL SUBCLAVIAN INNOMINATE UNI LEFT MOD SED; LEFT EXTREMITY ARTERIOGRAPHY; IR ULTRASOUND GUIDANCE VASC ACCESS RIGHT; BILATERAL COMMON CAROTID AND INNOMINATE ANGIOGRAPHY; IR ANGIO VERTEBRAL SEL VERTEBRAL UNI RIGHT MOD SED COMPARISON:  CT angiogram of the head and neck of 05/08/2019. MEDICATIONS: Heparin 2000 units intra-arterially. No antibiotic was administered within 1 hour of the procedure. ANESTHESIA/SEDATION: Versed 1 mg IV; Fentanyl 25 mcg IV. Moderate Sedation Time:  35 minutes minutes. The patient was continuously monitored during the procedure by the interventional radiology nurse under my direct supervision. CONTRAST:  Isovue 300 approximately 70 mm. FLUOROSCOPY TIME:  Fluoroscopy Time: 18 minutes 36 seconds (867 mGy). COMPLICATIONS: None immediate. TECHNIQUE: Informed written consent was obtained from the patient after a thorough discussion of the procedural risks, benefits and  alternatives. All questions were addressed. Maximal Sterile Barrier Technique was utilized including caps, mask, sterile gowns, sterile gloves, sterile drape, hand hygiene and skin antiseptic. A timeout was performed prior to the  initiation of the procedure. The right forearm to the wrist was prepped and draped in the usual sterile fashion. The right radial artery was then identified, and a dorsal palmar anastomosis verified. The right radial artery morphology was then evaluated and documented with ultrasound imaging. Using ultrasound guidance transradial access into the right radial artery was then obtained with a micropuncture needle. Over a 0.018 inch micro guidewire, a 4/5 French radial sheath was then advanced into the right radial artery without difficulty. Good aspiration was obtained from the side port of the radial sheath. A cocktail of 2000 units of heparin, 2.5 mg of verapamil, and 200 mcg of nitroglycerin were then injected in a diluted form without difficulty. A right radial angiogram was obtained. Over a 0.035 inch Roadrunner guidewire, a 5 [place] Simmons 2 diagnostic catheter was then advanced to the aortic arch region, and formed without difficulty. Selective cannulations were then performed with the right common carotid artery, the left common carotid artery, the left subclavian artery, the left vertebral artery, and the right vertebral artery. Following the procedure, the diagnostic catheter was removed. The right radial sheath was then removed with the application of the wrist band for hemostasis. The distal right radial pulse was then verified at the end of the procedure. FINDINGS: The right common carotid arteriogram demonstrates the right external carotid artery and its major branches to be widely patent. The right internal carotid artery at the bulb to the cranial skull base demonstrates wide patency. The petrous, cavernous and supraclinoid segments are widely patent. Right posterior communicating artery is seen opacifying the right posterior cerebral distribution. The right middle cerebral artery and the right anterior cerebral artery opacify into the capillary and venous phases. The left common carotid arteriogram  demonstrates the left external carotid artery and its major branches to be widely patent. The left internal carotid artery just distal to the bulb has a severe tapered narrowing of approximately 70% by the NASCET criteria. Distal to this the vessel then resumes normal caliber in its distal cervical segment. The petrous, cavernous and the supraclinoid segments are widely patent. The left middle cerebral artery and the left anterior cerebral artery opacify into the capillary and venous phases. A small infundibulum is noted at the origin of the left posterior-inferior cerebellar artery. The left middle cerebral artery and the left anterior cerebral artery opacify into the capillary and venous phases. The left subclavian arteriogram demonstrates ascending cervical branch of the left thyrocervical trunk. This ascends to the level of C2. The left vertebral artery origin is between the origins of the left subclavian artery, and the left common carotid artery. The vessel is seen to meander to the cranial skull base. Opacification is seen of the left vertebrobasilar junction and the left posterior-inferior cerebellar artery. On the lateral projection, the opacified portion of the basilar artery, the superior cerebellar arteries, the posterior cerebral artery on the left side, and of the anterior-inferior cerebellar arteries is grossly intact into the delayed arterial phase. Unopacified blood is seen in the basilar artery from the contralateral vertebral artery. The dominant right vertebral artery origin is widely patent. The vessel is seen to opacify to the cranial skull base. Wide patency is seen of the right posterior-inferior cerebellar artery and the right vertebrobasilar junction. The basilar artery, the left posterior cerebral artery,  the superior cerebellar arteries and anterior inferior cerebellar arteries opacify into the capillary and venous phases. Nonvisualization of the right posterior cerebral artery is related  to dominant right PCOM opacifying from the right internal carotid artery. 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 PLAN: Findings were reviewed with the patient. They understand the symptomatic nature of the left internal carotid artery severe stenosis given his recent symptoms. In order to prevent further left cerebral hemispheric ischemic events related to this stenosis, endovascular revascularization with distal protection was discussed. The procedure was discussed with the patient. This would be with distal protection with a risk of 1-2% chance of an ischemic stroke. Very remote possibility of reperfusion hemorrhage was also reviewed. The patient expressed understanding and wanted to proceed with endovascular revascularization of the symptomatic stenosis of the left internal carotid artery. Electronically Signed   By: Luanne Bras M.D.   On: 05/12/2019 13:20    Labs:  CBC: Recent Labs    05/08/19 0414 05/12/19 0704 05/22/19 0657 05/23/19 0242  WBC 5.5 5.1 4.8 5.0  HGB 13.1 13.4 13.9 11.9*  HCT 39.2 40.6 41.4 35.4*  PLT 182 194 185 158    COAGS: Recent Labs    05/07/19 1237 05/12/19 0704 05/22/19 0657  INR 0.9 1.0 1.0  APTT 25 28  --     BMP: Recent Labs    05/07/19 1237 05/12/19 0704 05/22/19 0657 05/23/19 0242  NA 138 141 139 139  K 3.7 3.6 3.8 3.6  CL 103 108 107 109  CO2 _0 GLUCOSE 171* 164* 146* 111*  BUN _1 CALCIUM 9.4 9.2 9.4 8.6*  CREATININE 1.08 1.23 1.10 1.07  GFRNONAA >60 >60 >60 >60  GFRAA >60 >60 >60 >60    LIVER FUNCTION TESTS: Recent Labs    05/07/19 1237  BILITOT 0.9  AST 23  ALT 24  ALKPHOS 73  PROT 7.6  ALBUMIN 4.3    TUMOR MARKERS: No results for input(s): AFPTM, CEA, CA199, CHROMGRNA in the last 8760 hours.  Assessment and Plan:  68 y/o M with history of left ICA stenosis s/p endovascular revascularization using  stent assisted angioplasty 05/22/19 with Dr. Estanislado Pandy who was seen today for routine follow up. He denies any complaints today except for the previously known lump at his right groin puncture site which was re-evaluated today and noted to be pulsatile. Per Dr. Estanislado Pandy it is most likely fibrous tissue resulting from arterial puncture on 7/30 however given the pulsatile nature we will send patient for [place] to r/o pseudoaneurysm.   Mr. Schauer has been given the ok to resume normal activity, driving, light exercise and stooping/bending. He will continue the Brilinta 90 mg BID + ASA 81 mg. He will undergo carotid US in approximately 3 months and follow up with our service after that time. Reviewed warning signs requiring immediate presentation to the ED including but not limited to vision changes, speech changes, trouble swallowing, facial asymmetry, numbness, tingling, severe headache, nausea, vomiting or profuse bleeding. Patient states understanding to all of the above and is in agreement with plan. He was encouraged to call our office with any questions or concerns.  Thank you for this interesting consult.  I greatly enjoyed meeting Henry Bridges and look forward to participating in their care.  A copy of this report was sent to the requesting provider on this date.  Electronically Signed:  Joaquim Nam, PA-C 06/05/2019, 11:13 AM   I spent a total of   40 Minutes in face to face in clinical consultation, greater than 50% of which was counseling/coordinating care for 2 week follow up left ICA stenosis s/p stent assisted angioplasty on 7/30.

## 2019-06-11 ENCOUNTER — Ambulatory Visit (HOSPITAL_COMMUNITY)
Admission: RE | Admit: 2019-06-11 | Discharge: 2019-06-11 | Disposition: A | Discharge status: Home / Self Care | Payer: BC Managed Care – PPO | Source: Ambulatory Visit | Attending: Interventional Radiology | Admitting: Interventional Radiology

## 2019-06-11 ENCOUNTER — Other Ambulatory Visit: Payer: Self-pay

## 2019-06-11 DIAGNOSIS — I729 Aneurysm of unspecified site: Secondary | ICD-10-CM | POA: Insufficient documentation

## 2019-06-11 DIAGNOSIS — R1909 Other intra-abdominal and pelvic swelling, mass and lump: Secondary | ICD-10-CM

## 2019-06-12 ENCOUNTER — Telehealth (HOSPITAL_COMMUNITY): Payer: Self-pay

## 2019-06-12 NOTE — Telephone Encounter (Signed)
Called Henry Bridges to inform him that Dr. Estanislado Pandy reviewed his ultrasound results and he stated that his lump is just fibrous tissue. He stated that it never really bothered him and he was glad to know that's all it was. He agreed to call if things change and he has issues. AW

## 2019-06-23 ENCOUNTER — Encounter: Payer: Self-pay | Admitting: Adult Health

## 2019-06-23 ENCOUNTER — Other Ambulatory Visit: Payer: Self-pay

## 2019-06-23 ENCOUNTER — Ambulatory Visit (INDEPENDENT_AMBULATORY_CARE_PROVIDER_SITE_OTHER): Payer: BC Managed Care – PPO | Admitting: Adult Health

## 2019-06-23 VITALS — BP 126/78 | HR 62 | Temp 96.0°F | Ht 70.5 in | Wt 236.4 lb

## 2019-06-23 DIAGNOSIS — E119 Type 2 diabetes mellitus without complications: Secondary | ICD-10-CM

## 2019-06-23 DIAGNOSIS — Z9889 Other specified postprocedural states: Secondary | ICD-10-CM | POA: Diagnosis not present

## 2019-06-23 DIAGNOSIS — I6522 Occlusion and stenosis of left carotid artery: Secondary | ICD-10-CM

## 2019-06-23 DIAGNOSIS — E785 Hyperlipidemia, unspecified: Secondary | ICD-10-CM | POA: Diagnosis not present

## 2019-06-23 DIAGNOSIS — Z95828 Presence of other vascular implants and grafts: Secondary | ICD-10-CM

## 2019-06-23 DIAGNOSIS — G459 Transient cerebral ischemic attack, unspecified: Secondary | ICD-10-CM | POA: Diagnosis not present

## 2019-06-23 DIAGNOSIS — I10 Essential (primary) hypertension: Secondary | ICD-10-CM

## 2019-06-23 NOTE — Patient Instructions (Signed)
Continue aspirin 81 mg daily and Brilinta (ticagrelor) 90 mg bid  and Lipitor for secondary stroke prevention  We will check cholesterol levels today to ensure adequate management with use of statin.  Your results will be released to your MyChart tomorrow morning as well as any additional recommendations based on your results  Follow-up with Dr. Estanislado Pandy in November for repeat imaging  Continue to follow up with PCP regarding cholesterol, blood pressure and diabetes management   Continue to stay active and maintain a healthy diet  Continue to monitor blood pressure at home  Maintain strict control of hypertension with blood pressure goal below 130/90, diabetes with hemoglobin A1c goal below 6.5% and cholesterol with LDL cholesterol (bad cholesterol) goal below 70 mg/dL. I also advised the patient to eat a healthy diet with plenty of whole grains, cereals, fruits and vegetables, exercise regularly and maintain ideal body weight.  Followup in the future with me in 4 months or call earlier if needed       Thank you for coming to see [place] at [facility]. I hope we have been able to provide you high quality care today.  You may receive a patient satisfaction survey over the next few weeks. We would appreciate your feedback and comments so that we may continue to improve ourselves and the health of our patients.

## 2019-06-23 NOTE — Progress Notes (Signed)
[facility] [address] [place]. [place] [address] [phone]       HOSPITAL FOLLOW UP NOTE  Mr. Henry Bridges Date of Birth:  [birth year] Medical Record Number:  [id number]   Reason for Referral:  hospital stroke follow up    CHIEF COMPLAINT:  Chief Complaint  Patient presents with   Follow-up    Treatment Room, alone. hosp f/u    HPI: Henry Bridges being seen today for in office hospital follow-up regarding left brain TIA secondary to left carotid stenosis on 05/07/2019 s/p left ICA revascularization on 05/22/2019.  History obtained from patient and chart review. Reviewed all radiology images and labs personally.  Henry Bridges is a 68 y.o. male with history of DB, HTN, GERD and tongue cancer who presented to [place] on 05/07/2019 with R hand weakness and incoordination associated w/ a frontal HA.  CT head negative for acute abnormality.  MRI without contrast showed left caudate nucleus decreased diffusion questionable infarct versus seizure.  MRI w/w/o unremarkable.  Carotid Doppler left ICA 60 to 79% stenosis.  CTA head/neck left ICA 60% stenosis.  2D echo normal EF without evidence of PFO or cardiac source of embolus.  EEG normal.  He was evaluated by vascular surgeon Dr. Estanislado Pandy who recommended undergoing carotid angiogram with potential left CAS ASAP outpatient with recommendations of continuing DAPT at discharge and further DAPT duration per Dr. Estanislado Pandy.  HTN stable and recommended long-term BP goal 1 30-1 50 prior to procedure due to left ICA stenosis.  LDL 120 and initiate atorvastatin 40 mg daily.  DM controlled with A1c 6.8.  Other stroke risk factors include advanced age, history of EtOH use and obesity but no prior history of stroke.  He was discharged home in stable condition without residual deficits.  Henry Bridges is being seen today for hospital follow-up.  He has been doing well without residual deficits or reoccurring/new  stroke/TIA symptoms.  He underwent image guided diagnostic cerebral arteriogram with Dr. Estanislado Pandy on 05/12/2019 diagnostic only without intervention due to elevated P2 Y 12.  He returned on 05/22/2019 where he underwent revascularization of left ICA stenosis using stent assisted angioplasty without complication.  He was initiated on Brilinta and aspirin 81 mg daily.  He will undergo repeat carotid ultrasound in 3 months.  He has continued on Brilinta and aspirin without bleeding or bruising.  He has continued on atorvastatin 40 mg daily without myalgias.  He has not had PCP follow-up at this time for recheck of lipid panel since discharge.  Blood pressure today satisfactory at 126/78.  No further concerns at this time.    ROS:   14 system review of systems performed and negative with exception of ringing in the ears  PMH:  Past Medical History:  Diagnosis Date   Arthritis    Diabetes mellitus without complication (HCC)    GERD (gastroesophageal reflux disease)    Gout    Hypertension    Hypothyroidism    PONV (postoperative nausea and vomiting)    Stroke (HCC)    TIA   Tongue cancer (HCC)     PSH:  Past Surgical History:  Procedure Laterality Date   bone spur     CYST EXCISION     on tailbone   FOOT SURGERY     lump of veins that was removed   IR ANGIO INTRA EXTRACRAN SEL COM CAROTID INNOMINATE BILAT MOD SED  05/12/2019   IR ANGIO VERTEBRAL SEL VERTEBRAL BILAT  MOD SED  05/12/2019   IR ANGIOGRAM EXTREMITY LEFT  05/12/2019   IR INTRAVSC STENT CERV CAROTID W/EMB-PROT MOD SED INCL ANGIO  05/22/2019   IR US GUIDE VASC ACCESS RIGHT  05/12/2019   RADIOLOGY WITH ANESTHESIA N/A 05/22/2019   Procedure: RADIOLOGY WITH ANESTHESIA;  Surgeon: Luanne Bras, MD;  Location: [place];  Service: Radiology;  Laterality: N/A;   SHOULDER ARTHROSCOPY Bilateral    to clean out bone spurs   TONGUE SURGERY      Social History:  Social History   Socioeconomic History   Marital  status: Married    Spouse name: Not on file   Number of children: Not on file   Years of education: Not on file   Highest education level: Not on file  Occupational History   Not on file  Social Needs   Financial resource strain: Not on file   Food insecurity    Worry: Not on file    Inability: Not on file   Transportation needs    Medical: Not on file    Non-medical: Not on file  Tobacco Use   Smoking status: Never Smoker   Smokeless tobacco: Never Used  Substance and Sexual Activity   Alcohol use: Not Currently   Drug use: Never   Sexual activity: Not on file  Lifestyle   Physical activity    Days per week: Not on file    Minutes per session: Not on file   Stress: Not on file  Relationships   Social connections    Talks on phone: Not on file    Gets together: Not on file    Attends religious service: Not on file    Active member of club or organization: Not on file    Attends meetings of clubs or organizations: Not on file    Relationship status: Not on file   Intimate partner violence    Fear of current or ex partner: Not on file    Emotionally abused: Not on file    Physically abused: Not on file    Forced sexual activity: Not on file  Other Topics Concern   Not on file  Social History Narrative   Not on file    Family History:  Family History  Problem Relation Age of Onset   Parkinson's disease Mother    Heart disease Father     Medications:   Current Outpatient Medications on File Prior to Visit  Medication Sig Dispense Refill   amLODipine (NORVASC) 10 MG tablet Take 10 mg by mouth daily.     aspirin EC 81 MG tablet Take 81 mg by mouth daily.     atorvastatin (LIPITOR) 40 MG tablet Take 40 mg by mouth daily at 6 PM.     doxazosin (CARDURA) 2 MG tablet Take 2 mg by mouth daily.     hydrochlorothiazide (HYDRODIURIL) 25 MG tablet Take 25 mg by mouth daily.     lansoprazole (PREVACID) 30 MG capsule Take 30 mg by mouth daily at  12 noon.     levothyroxine (SYNTHROID) 125 MCG tablet Take 125 mcg by mouth daily before breakfast.     losartan (COZAAR) 100 MG tablet Take 100 mg by mouth daily.     metFORMIN (GLUCOPHAGE) 1000 MG tablet Take 1,000 mg by mouth 2 (two) times daily with a meal.     Omega-3 Fatty Acids (FISH OIL) 1000 MG CPDR Take 1,000-2,000 mg by mouth See admin instructions. Take 2 capsules in the morning, and  1 capsules in the evening     ticagrelor (BRILINTA) 90 MG TABS tablet Take by mouth 2 (two) times daily.     UNABLE TO FIND 4 capsules daily. Med Name: Derek Jack Life long vitality pack. Essential oil supplements. There are 3 different caps. "Take four capsules of vEO Mega, Vegan Microplex VMZ, and Alpha CRS+ per day with food."     atorvastatin (LIPITOR) 20 MG tablet Take 2 tablets (40 mg total) by mouth daily at 6 PM. (Patient not taking: Reported on 05/12/2019) 60 tablet 2   No current facility-administered medications on file prior to visit.     Allergies:   Allergies  Allergen Reactions   Aspirin Other (See Comments)    Made his heartburn worse     Physical Exam  Vitals:   06/23/19 1519  BP: 126/78  Pulse: 62  Temp: (!) 96 F (35.6 C)  Weight: 236 lb 6.4 oz (107.2 kg)  Height: 5' 10.5" (1.791 m)   Body mass index is 33.44 kg/m. No exam data present  Depression screen [facility] 2/9 06/23/2019  Decreased Interest 0  Down, Depressed, Hopeless 0  PHQ - 2 Score 0     General: well developed, well nourished,  pleasant middle-age Caucasian male, seated, in no evident distress Head: head normocephalic and atraumatic.   Neck: supple with no carotid or supraclavicular bruits Cardiovascular: regular rate and rhythm, no murmurs Musculoskeletal: no deformity Skin:  no rash/petichiae Vascular:  Normal pulses all extremities   Neurologic Exam Mental Status: Awake and fully alert. Oriented to place and time. Recent and remote memory intact. Attention span, concentration and fund of  knowledge appropriate. Mood and affect appropriate.  Cranial Nerves: Fundoscopic exam reveals sharp disc margins. Pupils equal, briskly reactive to light. Extraocular movements full without nystagmus. Visual fields full to confrontation. Hearing intact. Facial sensation intact. Face, tongue, palate moves normally and symmetrically.  Motor: Normal bulk and tone. Normal strength in all tested extremity muscles. Sensory.: intact to touch , pinprick , position and vibratory sensation.  Coordination: Rapid alternating movements normal in all extremities. Finger-to-nose and heel-to-shin performed accurately bilaterally. Gait and Station: Arises from chair without difficulty. Stance is normal. Gait demonstrates normal stride length and balance Reflexes: 1+ and symmetric. Toes downgoing.     NIHSS  0 Modified Rankin  0    Diagnostic Data (Labs, Imaging, Testing)  CT HEAD WO CONTRAST 05/07/2019 IMPRESSION: 1. No evidence of acute intracranial abnormality. 2. ASPECTS is 10. 3. Mild chronic small vessel ischemic disease.  CT ANGIO HEAD W OR WO CONTRAST CT ANGIO NECK W OR WO CONTRAST 05/08/2019 IMPRESSION: 1. 50% diameter stenosis proximal left internal carotid artery 2. Right carotid artery widely patent. Both vertebral arteries widely patent. 3. Negative for intracranial large vessel occlusion or significant stenosis.  [name] CONTRAST 05/07/2019 IMPRESSION: Mildly decreased diffusion of the left caudate nucleus. Findings may represent acute/early subacute infarction, however, the distribution is somewhat atypical for lacunar infarction raising the question of seizure related activity or metabolic/toxic injury.  MR BRAIN W/WO CONTRAST 05/08/2019 IMPRESSION: No acute abnormality Diffusion-weighted signal left caudate on yesterday's study appears to be artifactual. Mild chronic white matter changes likely due to microvascular ischemia.  ECHOCARDIOGRAM 05/08/2019 IMPRESSIONS   1. The left ventricle has normal systolic function, with an ejection fraction of 55-60%. The cavity size was normal. Left ventricular diastolic parameters were normal.  2. The right ventricle has normal systolic function. The cavity was normal. There is no increase in right ventricular  wall thickness. Right ventricular systolic pressure is normal with an estimated pressure of 43.2 mmHg.  3. Left atrial size was mildly dilated.  4. Mild thickening of the mitral valve leaflet. Mild calcification of the mitral valve leaflet.  5. The aortic valve is tricuspid. Mild thickening of the aortic valve. Mild calcification of the aortic valve.  6. The interatrial septum was not well visualized.  DIAGNOSTIC ARTERIOGRAM 05/12/2019 IMPRESSION: Approximately 70% stenosis by the NASCET criteria of the proximal left internal carotid artery just distal to the bulb.  INTRAVASCULAR STENT CERVICAL CAROTID 05/22/2019 IMPRESSION: Status post endovascular revascularization of symptomatic high-grade stenosis of the left internal carotid artery proximally with distal protection.     ASSESSMENT: Henry Bridges is a 68 y.o. year old male presented with right hand clumsiness on 05/07/2019 with stroke work-up revealing left brain TIA secondary to left carotid stenosis.  He underwent revascularization on 05/22/2019 with Dr. Estanislado Pandy.  Vascular risk factors include HTN, HLD, DM, tongue cancer s/p radiation, left ICA stenosis, and history of EtOH use.  He has been doing well since discharge without new or recurring stroke/TIA symptoms.    PLAN:  1. Left brain TIA: Continue aspirin 81 mg daily and Brilinta (ticagrelor) 90 mg bid  and atorvastatin for secondary stroke prevention. Maintain strict control of hypertension with blood pressure goal below 130/90, diabetes with hemoglobin A1c goal below 6.5% and cholesterol with LDL cholesterol (bad cholesterol) goal below 70 mg/dL.  I also advised the patient to eat a healthy  diet with plenty of whole grains, cereals, fruits and vegetables, exercise regularly with at least 30 minutes of continuous activity daily and maintain ideal body weight. 2. Left ICA stenosis s/p stent: Continue aspirin 81 mg and Brilinta as recommended by vascular surgery.  Plans on following up 3 months post procedure for repeat carotid ultrasound.  It will determined at that time for need of ongoing use of Brilinta 3. HTN: Advised to continue current treatment regimen.  Today's BP stable.  Advised to continue to monitor at home along with continued follow-up with PCP for management 4. HLD: Advised to continue current treatment regimen with repeat lipid panel obtained at today's visit to ensure adequate management with initiation of statin 5. DMII: Advised to continue to monitor glucose levels at home along with continued follow-up with PCP for management and monitoring    Follow up in 4 months or call earlier if needed   Greater than 50% of time during this 45 minute visit was spent on counseling, explanation of diagnosis of TIA, reviewing risk factor management of left ICA status post 10, HTN, HLD and DM, planning of further management along with potential future management, and discussion with patient and family answering all questions.    Frann Rider, AGNP-BC  [facility] [address] [place], [place] [address]  Phone [phone] Fax [phone] Note: This document was prepared with digital dictation and possible smart phrase technology. Any transcriptional errors that result from this process are unintentional.

## 2019-06-24 LAB — LIPID PANEL
Chol/HDL Ratio: 2.3 ratio (ref 0.0–5.0)
Cholesterol, Total: 151 mg/dL (ref 100–199)
HDL: 67 mg/dL (ref >39)
LDL Chol Calc (NIH): 71 mg/dL (ref 0–99)
Triglycerides: 66 mg/dL (ref 0–149)
VLDL Cholesterol Cal: 13 mg/dL (ref 5–40)

## 2019-06-24 NOTE — Progress Notes (Signed)
I agree with the above plan 

## 2019-08-27 DIAGNOSIS — E1169 Type 2 diabetes mellitus with other specified complication: Secondary | ICD-10-CM | POA: Diagnosis not present

## 2019-08-27 DIAGNOSIS — E7800 Pure hypercholesterolemia, unspecified: Secondary | ICD-10-CM | POA: Diagnosis not present

## 2019-08-27 DIAGNOSIS — Z8739 Personal history of other diseases of the musculoskeletal system and connective tissue: Secondary | ICD-10-CM | POA: Diagnosis not present

## 2019-08-27 DIAGNOSIS — E039 Hypothyroidism, unspecified: Secondary | ICD-10-CM | POA: Diagnosis not present

## 2019-08-27 DIAGNOSIS — I10 Essential (primary) hypertension: Secondary | ICD-10-CM | POA: Diagnosis not present

## 2019-08-27 DIAGNOSIS — Z0000 Encounter for general adult medical examination without abnormal findings: Secondary | ICD-10-CM | POA: Diagnosis not present

## 2019-08-27 DIAGNOSIS — Z23 Encounter for immunization: Secondary | ICD-10-CM | POA: Diagnosis not present

## 2019-09-15 ENCOUNTER — Other Ambulatory Visit (HOSPITAL_COMMUNITY): Payer: Self-pay | Admitting: Interventional Radiology

## 2019-09-15 DIAGNOSIS — I771 Stricture of artery: Secondary | ICD-10-CM

## 2019-09-16 ENCOUNTER — Other Ambulatory Visit: Payer: Self-pay

## 2019-09-16 ENCOUNTER — Ambulatory Visit (HOSPITAL_COMMUNITY)
Admission: RE | Admit: 2019-09-16 | Discharge: 2019-09-16 | Disposition: A | Discharge status: Home / Self Care | Payer: BC Managed Care – PPO | Source: Ambulatory Visit | Attending: Interventional Radiology | Admitting: Interventional Radiology

## 2019-09-16 DIAGNOSIS — I771 Stricture of artery: Secondary | ICD-10-CM | POA: Insufficient documentation

## 2019-09-22 ENCOUNTER — Telehealth: Payer: Self-pay | Admitting: Student

## 2019-09-22 NOTE — Telephone Encounter (Signed)
NIR.  Patient with history of proximal left ICA stenosis s/p cerebral arteriogram with revascularization using stent assisted angioplasty 05/22/2019 by Dr. Estanislado Pandy.  Received message from patient regarding Brilinta use.  Called patient at 1323 to discuss. Patient states that he would like to know if he can discontinue Brilinta use, as he recently had his carotid US 09/16/2019. Explained that based on this [place], his stent is stable. Per Dr. Estanislado Pandy, recommend repeat carotid US 6 months from prior. In addition, Dr. Estanislado Pandy recommends that patient continue DAPT (Brilinta 90 mg twice daily and Aspirin 81 mg once daily) until results of next carotid US- at that time, will revisit DAPT use. Patient states that he needs refill of this medication. All questions answered and concerns addressed. Patient conveys understanding and agrees with plan.  Called [facility] in [place], [place] [phone]) at [phone] to fill prescription- Brilinta 90 mg tablets, take one tablet by mouth twice daily, dispense 60 tablets with 3 refills.   Bea Graff Louk, PA-C 09/22/2019, 1:33 PM

## 2019-09-25 DIAGNOSIS — E119 Type 2 diabetes mellitus without complications: Secondary | ICD-10-CM | POA: Diagnosis not present

## 2019-09-25 DIAGNOSIS — H5213 Myopia, bilateral: Secondary | ICD-10-CM | POA: Diagnosis not present

## 2019-10-13 ENCOUNTER — Telehealth (HOSPITAL_COMMUNITY): Payer: Self-pay

## 2019-10-13 ENCOUNTER — Telehealth: Payer: Self-pay | Admitting: Radiology

## 2019-10-13 ENCOUNTER — Other Ambulatory Visit: Payer: Self-pay | Admitting: Radiology

## 2019-10-13 DIAGNOSIS — I6522 Occlusion and stenosis of left carotid artery: Secondary | ICD-10-CM

## 2019-10-13 MED ORDER — TICAGRELOR 90 MG PO TABS: 90.0000 mg | ORAL_TABLET | Freq: Two times a day (BID) | ORAL | 3 refills | Status: DC

## 2019-10-13 NOTE — Progress Notes (Signed)
Patient called requesting Brilinta [facility] be [facility] local [facility] to [facility] order. Prescription discontiuned at local pharmacy and faxed directly to [facility] with receipt of confirmation. Patient made aware change has been made. No additional concerns, all questions answered.Henry Bridges

## 2019-10-13 NOTE — Telephone Encounter (Signed)
Pt called to get a refill on his Brilinta at [facility]. Sent a message to our PA to call in refill at new pharmacy. AW

## 2019-11-25 ENCOUNTER — Telehealth: Payer: Self-pay | Admitting: Student

## 2019-11-25 NOTE — Telephone Encounter (Signed)
NIR.  Received call from patient requesting 90 day refill for Brilinta. Faxed prescription to [facility] [phone]) at 1211 to fill prescription- Brilinta 90 mg tablets, take one tablet by mouth twice daily, dispense 180 tablets with 3 refills.   Bea Graff Resean Brander, PA-C 11/25/2019, 12:52 PM

## 2019-12-02 ENCOUNTER — Other Ambulatory Visit: Payer: Self-pay | Admitting: Physician Assistant

## 2019-12-02 MED ORDER — TICAGRELOR 90 MG PO TABS: 90.0000 mg | ORAL_TABLET | Freq: Two times a day (BID) | ORAL | 2 refills | Status: DC

## 2020-03-24 ENCOUNTER — Other Ambulatory Visit (HOSPITAL_COMMUNITY): Payer: Self-pay | Admitting: Interventional Radiology

## 2020-03-24 DIAGNOSIS — I6522 Occlusion and stenosis of left carotid artery: Secondary | ICD-10-CM

## 2020-03-31 ENCOUNTER — Ambulatory Visit (HOSPITAL_COMMUNITY)
Admission: RE | Admit: 2020-03-31 | Discharge: 2020-03-31 | Disposition: A | Discharge status: Home / Self Care | Payer: BC Managed Care – PPO | Source: Ambulatory Visit | Attending: Interventional Radiology | Admitting: Interventional Radiology

## 2020-03-31 ENCOUNTER — Other Ambulatory Visit: Payer: Self-pay

## 2020-03-31 DIAGNOSIS — I6522 Occlusion and stenosis of left carotid artery: Secondary | ICD-10-CM | POA: Diagnosis not present

## 2020-04-05 ENCOUNTER — Telehealth (HOSPITAL_COMMUNITY): Payer: Self-pay

## 2020-04-05 ENCOUNTER — Telehealth: Payer: Self-pay | Admitting: Student

## 2020-04-05 NOTE — Telephone Encounter (Signed)
NIR.  Received call from patient regarding stopping Brilinta use secondary to excess bleeding/brusing. Discussed case with Dr. Estanislado Pandy who recommends discontinuing Brilinta/Aspirin 81 mg use, and begin taking Aspirin 325 mg once daily. Called patient's cell at [phone] to discuss above. All questions answered and concerns addressed. Patient conveys understanding and agrees with plan.  Please call NIR with questions/concerns.   Bea Graff Winnie Umali, PA-C 04/05/2020, 1:25 PM

## 2020-04-05 NOTE — Telephone Encounter (Signed)
Pt agreed to f/u in 6 months with US carotid. He wants to know if he can d/c his Brilinta. Have sent a message to our PA to advise. AW

## 2020-04-05 NOTE — Progress Notes (Signed)
ERROR

## 2020-05-21 ENCOUNTER — Telehealth: Payer: Self-pay | Admitting: Student

## 2020-05-21 NOTE — Telephone Encounter (Signed)
Refill of Brilinta 90mg  BID # 60 with 3 refills called into [facility].   Brynda Greathouse, MS RD PA-C

## 2020-09-28 DIAGNOSIS — Z8371 Family history of colonic polyps: Secondary | ICD-10-CM | POA: Diagnosis not present

## 2020-10-07 DIAGNOSIS — E7800 Pure hypercholesterolemia, unspecified: Secondary | ICD-10-CM | POA: Diagnosis not present

## 2020-10-07 DIAGNOSIS — Z0000 Encounter for general adult medical examination without abnormal findings: Secondary | ICD-10-CM | POA: Diagnosis not present

## 2020-10-07 DIAGNOSIS — Z8739 Personal history of other diseases of the musculoskeletal system and connective tissue: Secondary | ICD-10-CM | POA: Diagnosis not present

## 2020-10-07 DIAGNOSIS — Z125 Encounter for screening for malignant neoplasm of prostate: Secondary | ICD-10-CM | POA: Diagnosis not present

## 2020-10-07 DIAGNOSIS — R8290 Unspecified abnormal findings in urine: Secondary | ICD-10-CM | POA: Diagnosis not present

## 2020-10-07 DIAGNOSIS — E1169 Type 2 diabetes mellitus with other specified complication: Secondary | ICD-10-CM | POA: Diagnosis not present

## 2020-10-19 DIAGNOSIS — H5213 Myopia, bilateral: Secondary | ICD-10-CM | POA: Diagnosis not present

## 2020-10-19 DIAGNOSIS — H52203 Unspecified astigmatism, bilateral: Secondary | ICD-10-CM | POA: Diagnosis not present

## 2020-10-19 DIAGNOSIS — E119 Type 2 diabetes mellitus without complications: Secondary | ICD-10-CM | POA: Diagnosis not present

## 2020-10-20 ENCOUNTER — Other Ambulatory Visit (HOSPITAL_COMMUNITY): Payer: Self-pay | Admitting: Interventional Radiology

## 2020-10-20 DIAGNOSIS — I771 Stricture of artery: Secondary | ICD-10-CM

## 2020-10-21 ENCOUNTER — Telehealth (HOSPITAL_COMMUNITY): Payer: Self-pay

## 2020-10-21 ENCOUNTER — Other Ambulatory Visit: Payer: Self-pay

## 2020-10-21 ENCOUNTER — Ambulatory Visit (HOSPITAL_COMMUNITY)
Admission: RE | Admit: 2020-10-21 | Discharge: 2020-10-21 | Disposition: A | Discharge status: Home / Self Care | Payer: BC Managed Care – PPO | Source: Ambulatory Visit | Attending: Interventional Radiology | Admitting: Interventional Radiology

## 2020-10-21 DIAGNOSIS — I771 Stricture of artery: Secondary | ICD-10-CM

## 2020-10-21 NOTE — Progress Notes (Signed)
Carotid artery duplex completed. Refer to "CV Proc" under chart review to view preliminary results.  10/21/2020 9:43 AM Eula Fried., MHA, RVT, RDCS, RDMS

## 2020-10-21 NOTE — Telephone Encounter (Signed)
Pt agreed to f/u in 6 months with us carotid. AW 

## 2020-11-02 DIAGNOSIS — Z01812 Encounter for preprocedural laboratory examination: Secondary | ICD-10-CM | POA: Diagnosis not present

## 2020-11-05 DIAGNOSIS — K640 First degree hemorrhoids: Secondary | ICD-10-CM | POA: Diagnosis not present

## 2020-11-05 DIAGNOSIS — Z1211 Encounter for screening for malignant neoplasm of colon: Secondary | ICD-10-CM | POA: Diagnosis not present

## 2020-11-05 DIAGNOSIS — Z8371 Family history of colonic polyps: Secondary | ICD-10-CM | POA: Diagnosis not present

## 2021-03-25 DIAGNOSIS — I10 Essential (primary) hypertension: Secondary | ICD-10-CM | POA: Diagnosis not present

## 2021-03-25 DIAGNOSIS — E1169 Type 2 diabetes mellitus with other specified complication: Secondary | ICD-10-CM | POA: Diagnosis not present

## 2021-03-30 DIAGNOSIS — E1169 Type 2 diabetes mellitus with other specified complication: Secondary | ICD-10-CM | POA: Diagnosis not present

## 2021-03-30 DIAGNOSIS — I10 Essential (primary) hypertension: Secondary | ICD-10-CM | POA: Diagnosis not present

## 2021-03-30 DIAGNOSIS — E039 Hypothyroidism, unspecified: Secondary | ICD-10-CM | POA: Diagnosis not present

## 2021-03-30 DIAGNOSIS — N1831 Chronic kidney disease, stage 3a: Secondary | ICD-10-CM | POA: Diagnosis not present

## 2021-04-20 ENCOUNTER — Other Ambulatory Visit (HOSPITAL_COMMUNITY): Payer: Self-pay | Admitting: Interventional Radiology

## 2021-04-20 DIAGNOSIS — I771 Stricture of artery: Secondary | ICD-10-CM

## 2021-04-28 IMAGING — XA INTRAVSC STENT CERV CAROTID W/ EMB-PROT
9 of 13 series · 10 of 24 positions shown · IV contrast (IODINE)
Comparison: Diagnostic arteriogram May 12, 2019.

CLINICAL DATA: Symptomatic left internal carotid artery proximal
stenosis.

EXAM:
INTRAVSC STENT CERV CAROTID W/ EMB-PROT
TECHNIQUE: Informed written consent was obtained from the patient after a
thorough discussion of the procedural risks, benefits and
alternatives. All questions were addressed. Maximal Sterile Barrier
Technique was utilized including caps, mask, sterile gowns, sterile
gloves, sterile drape, hand hygiene and skin antiseptic. A timeout
was performed prior to the initiation of the procedure.

[Series 2: cerebral · 1 of 8 slices shown (1 of 9)]
[im 8/8]
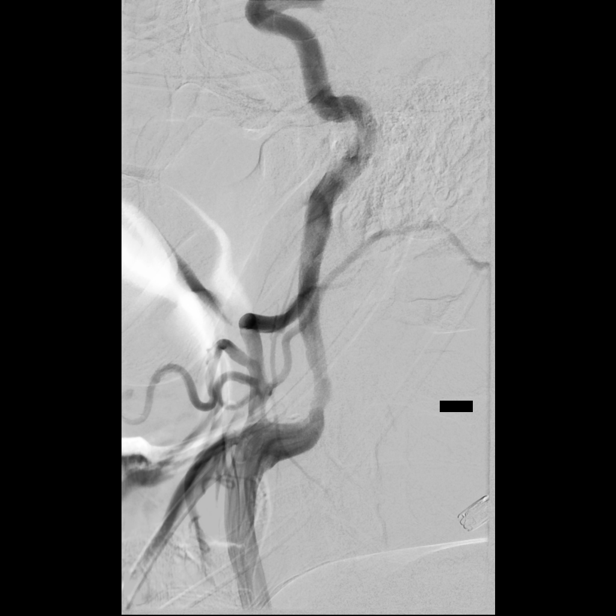

[Series 3: cerebral · 2 of 22 slices shown (2 of 9)]
[im 6/22]
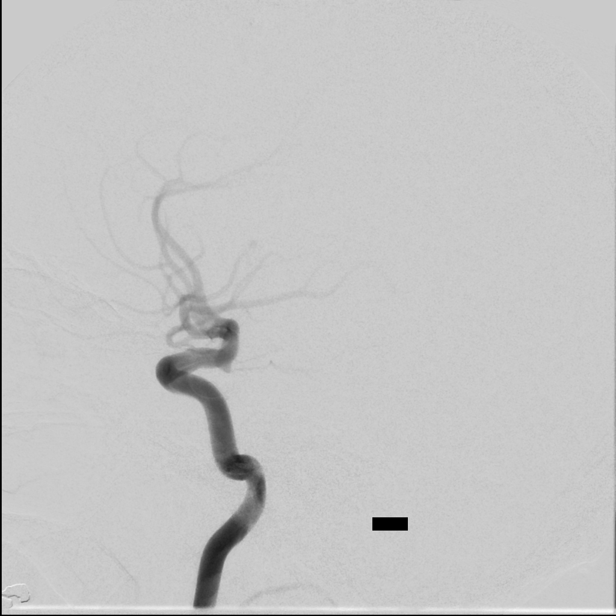
[im 22/22]
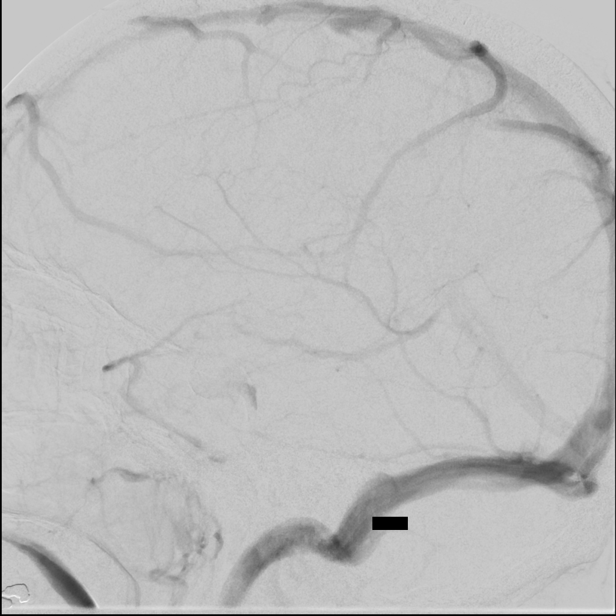

[Series 5: cerebral · 1 of 6 slices shown (3 of 9)]
[im 1/6]
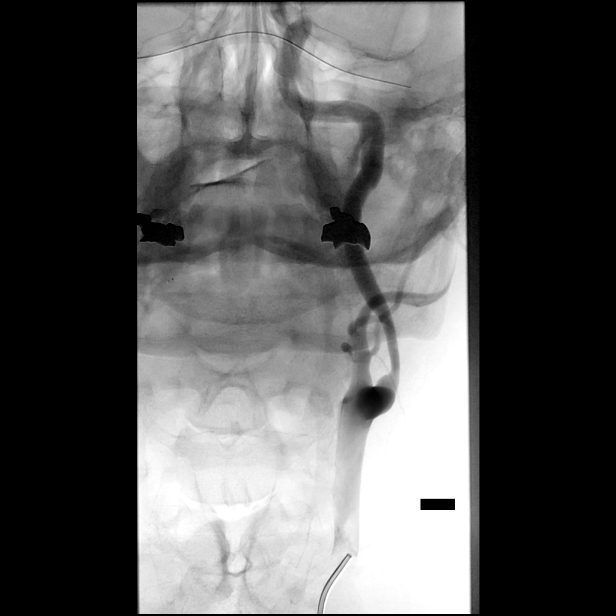

[Series 7: cerebral · 1 of 10 slices shown (4 of 9)]
[im 1/10]
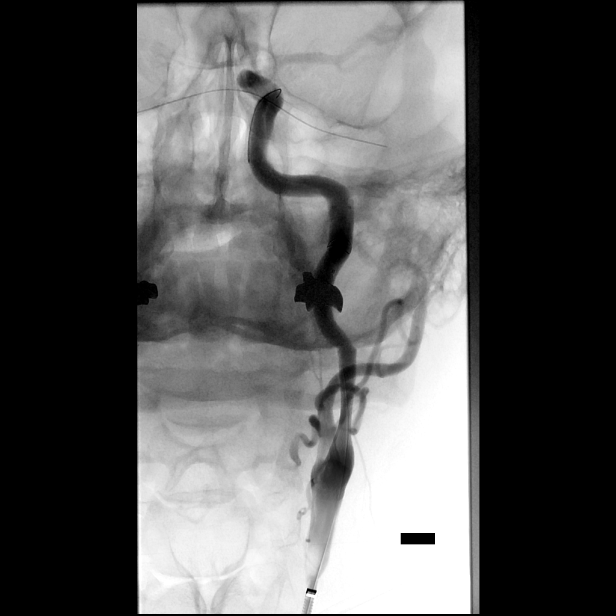

[Series 8: cerebral · 1 of 10 slices shown (5 of 9)]
[im 10/10]
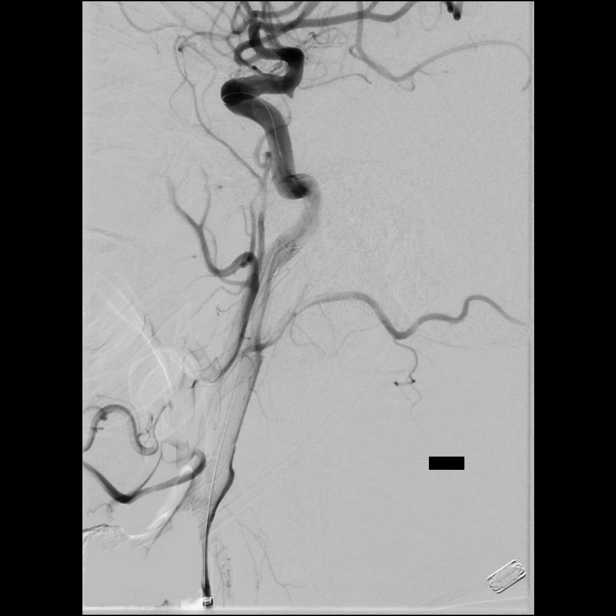

[Series 10: cerebral · 1 of 18 slices shown (6 of 9)]
[im 1/18]
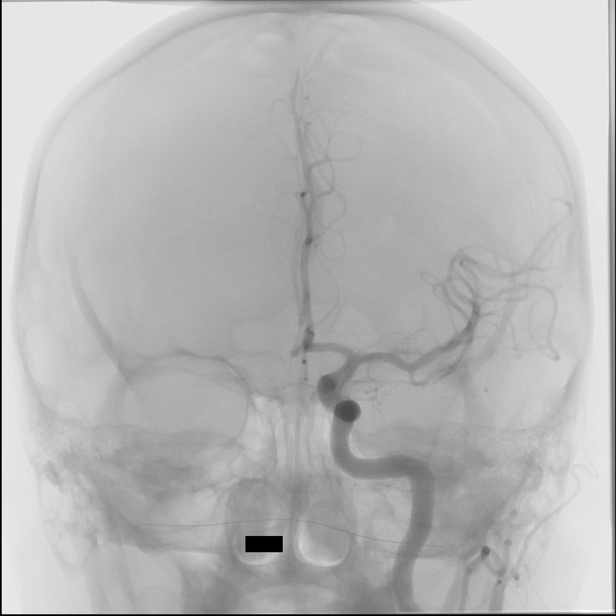

[Series 11: cerebral · 1 of 8 slices shown (7 of 9)]
[im 1/8]
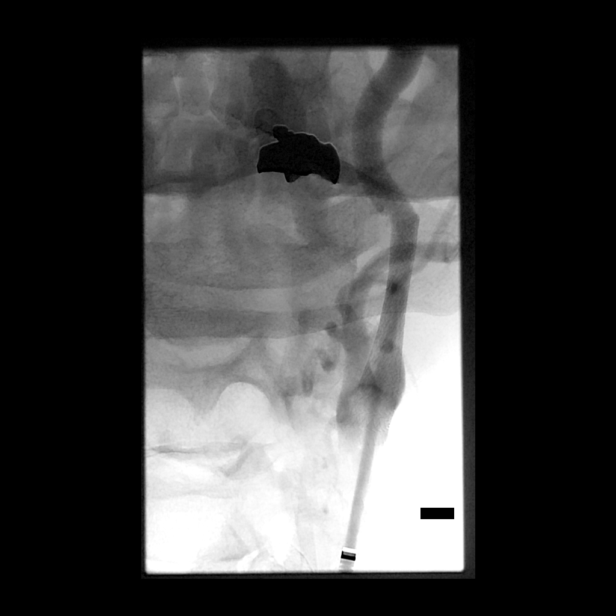

[Series 14: cerebral · 1 of 8 slices shown (8 of 9)]
[im 1/8]
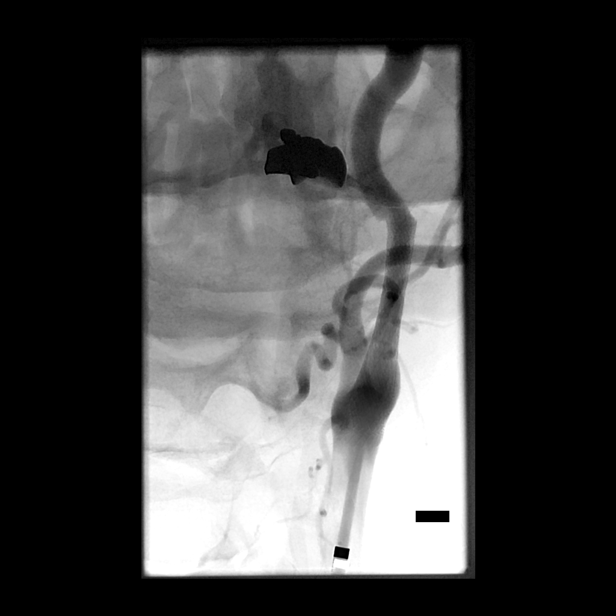

[Series 15: cerebral · 1 of 18 slices shown (9 of 9)]
[im 9/18]
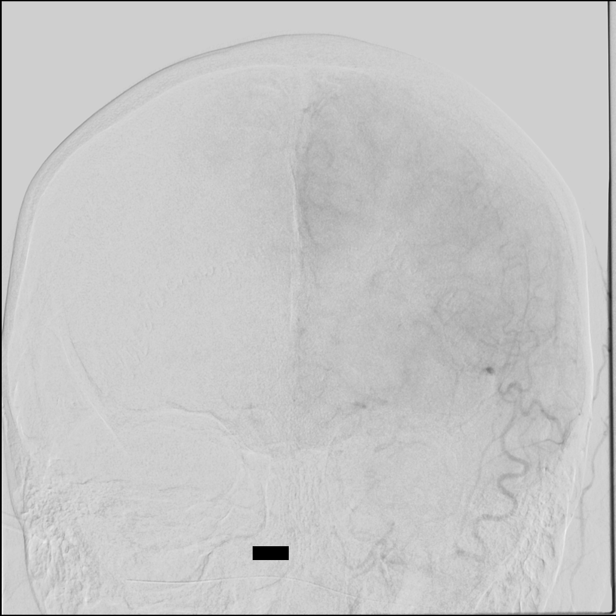

[10 of 24 positions shown; findings below may reference images not displayed]

MEDICATIONS:
Heparin 4,500 units IV; Ancef 2 g IV antibiotic was administered
within 1 hour of the procedure.

ANESTHESIA/SEDATION:
Mac anesthesia as per the [REDACTED] at [REDACTED].

CONTRAST:  Isovue 300 approximately 60 mL.

FLUOROSCOPY TIME:  Fluoroscopy Time: 27 minutes 24 seconds (4161
mGy).

COMPLICATIONS:
None immediate.
The right groin was prepped and draped in the usual sterile fashion.
Thereafter using modified Seldinger technique, transfemoral access
into the right common femoral artery was obtained without
difficulty. Over a 0.035 inch guidewire, a 5 French Pinnacle sheath
was inserted. Through this, and also over 0.035 inch guidewire, a 5
French JB 1 catheter was advanced to the aortic arch region and
selectively positioned in the left common carotid artery.
FINDINGS: The left common carotid arteriogram demonstrates the left external
carotid artery and its major branches to be widely patent.

The left internal carotid artery just distal to the bulb again
demonstrates a segmental focal area of severe stenosis extending
distal to the left internal carotid artery bulb. Distal to this the
vessel assumes normal caliber. The petrous, cavernous and the
supraclinoid segments demonstrate wide patency.

The left middle cerebral artery and the left anterior cerebral
artery opacify into the capillary and venous phases.

Cross-filling via the anterior communicating artery of the right
anterior cerebral artery A2 segment and distally is seen.
Endovascular stent assisted angioplasty of symptomatic left internal
carotid artery proximally with distal protection.

The diagnostic JB 1 catheter in the left common carotid artery was
then exchanged over a 0.035 inch 300 cm Rosen exchange guidewire for
a 6 French 80 cm Bamboom [place] using biplane roadmap technique
and constant fluoroscopic guidance.

The exchange guidewire was removed. Good aspiration was obtained
from the hub of the Bamboom [place]. This was then connected to
continuous heparinized saline infusion.

Measurements were performed of the left internal carotid artery in
its distal vertical segment, and also the left internal carotid
artery in its most normal segment distal to the diseased segment,
and the left common carotid artery distally.

A 4/7 mm Emboshield distal protection device was then prepped in
heparinized saline infusion in its housing.

The filter was cleared of the air bubbles and retrieved into the
filter delivery catheter. The combination of the 014 inch wire, and
the filter delivery catheter was then retrieved from its housing. A
J configuration was given to the 014 inch micro guidewire.

Thereafter, the combination of the micro guidewire and the delivery
filter microcatheter were advanced to the distal end of the Zana
Izzie Meyers.

Using a torque device the micro guidewire was then directed across
the tight stenosis and into the horizontal petrous cavernous segment
with the filter delivery catheter. The Emboshield filter was then
deployed in the usual manner without difficulty. The delivery
microcatheter was then gently retrieved under constant fluoroscopic
guidance as the delivery micro guidewire was maintained in the
horizontal petrous segment.

A control arteriogram performed through the Bamboom [place]
following the delivery of the filter demonstrated no evidence of
spasms or dissections or of an intracranial filling defects. An Xact
8-6 mm x 40 mm stent was then prepped retrogradely with heparinized
saline infusion.

Using the rapid exchange technique, the stent with its delivery
catheter were then advanced under constant fluoroscopic guidance
using the rapid exchange technique to the left internal carotid
proximally.

The proximal and the distal landing zones were defined. The stent
was then deployed without any complications.

The stent delivery apparatus was then retrieved and removed.

Control arteriograms performed through the Bamboom [place] in
the left common carotid artery demonstrated excellent apposition of
the stent distally in the left internal carotid artery, and
proximally in the left common carotid artery.

There continued to be a waist at the site of the previously noted
tight stenosis.

This prompted stent angioplasty with a 5 mm x 30 mm Viatrac 014
balloon angioplasty catheter. This after having been prepped was
again advanced to the stent using the rapid exchange technique. The
balloon was then gently inflated using a micro inflation syringe
device via micro tubing to just over 5 mm where it was maintained
for approximately 30 seconds. At this time no evidence of
hypotension or bradycardia was seen.

The balloon was then deflated and retrieved and removed. A control
arteriogram performed through the Bamboom [place] demonstrated
excellent apposition and relief of the waist post angioplasty.

A control arteriogram performed distally demonstrated no evidence of
intraluminal filling defects or of occlusion in the anterior
circulation intracranially.

Filter capture device was then advanced again using the rapid
exchange technique to the proximal end of the filter. The filter was
then captured into the filter capture catheter by retrieving the
delivery micro guidewire. The combination was then retrieved and
removed without any complications. Control arteriograms were then
performed at 15 and 35 minutes post angioplasty within the stent.
These continued to demonstrate no evidence of intraluminal filling
defects or of intracranial abnormalities.

During this time the patient was given about 1.5 mg of Integrilin to
abort any intrastent platelet aggregation.

None was seen. A final control arteriogram performed through the
Jose Ito Chau in the left common carotid artery continued to
demonstrate excellent flow through the stented segment of the
proximal left internal carotid artery, without any evidence
angiographically of intraluminal filling defects or of occlusions.

The Bamboom [place] was then removed and replaced with a 6
French Pinnacle sheath which in turn was then removed with the
successful application of a 6 French Angio-Seal closure device. The
right groin appeared soft without evidence of hematoma or bleeding.
Distal pulses remained palpable in the dorsalis pedis arteries
bilaterally, and Dopplerable posterior tibial arteries bilaterally
unchanged from prior to the procedure.

Neurologically the patient remained stable without any new evidence
of headaches, nausea, vomiting, visual changes, speech aberrations,
or motor weakness. The patient was then transferred to the neuro ICU
to continue on low-dose IV heparin, and close monitoring of
patient's blood pressure and neurological status. His stay overnight
was unremarkable. The following morning, the IV heparin was stopped
and patient switched to aspirin 81 mg a day, and Brilinta 90 mg a
day. He sat in a chair and ambulated without difficulty. Patient was
then discharged under the care of his wife. Special instructions
were given in regards to maintaining adequate hydration and to avoid
stooping, bending or lifting weights above 20 pounds for about a
couple of weeks. He was asked to refrain from smoking also. He was
advised to call 911 should he have severe headaches, nausea,
vomiting, or facial weakness, speech aberrations or experiencing
visual symptoms such as amaurosis fugax and blindness.

Patient expressed understanding and agreed to the above management
plan.
IMPRESSION: Status post endovascular revascularization of symptomatic high-grade
stenosis of the left internal carotid artery proximally with distal
protection.

PLAN:
Follow-up in the clinic 2 to 3 weeks post discharge.

## 2021-04-29 ENCOUNTER — Ambulatory Visit (HOSPITAL_COMMUNITY)
Admission: RE | Admit: 2021-04-29 | Discharge: 2021-04-29 | Disposition: A | Discharge status: Home / Self Care | Payer: BC Managed Care – PPO | Source: Ambulatory Visit | Attending: Interventional Radiology | Admitting: Interventional Radiology

## 2021-04-29 ENCOUNTER — Other Ambulatory Visit: Payer: Self-pay

## 2021-04-29 DIAGNOSIS — I771 Stricture of artery: Secondary | ICD-10-CM | POA: Insufficient documentation

## 2021-04-29 NOTE — Progress Notes (Signed)
Carotid duplex has been completed.   Preliminary results in CV Proc.   Abram Sander 04/29/2021 4:14 PM

## 2021-05-10 ENCOUNTER — Telehealth (HOSPITAL_COMMUNITY): Payer: Self-pay

## 2021-05-10 NOTE — Telephone Encounter (Signed)
Pt agreed to f/u in 6 months with us carotid. AW 

## 2021-10-14 DIAGNOSIS — H5213 Myopia, bilateral: Secondary | ICD-10-CM | POA: Diagnosis not present

## 2021-10-14 DIAGNOSIS — E119 Type 2 diabetes mellitus without complications: Secondary | ICD-10-CM | POA: Diagnosis not present

## 2021-10-14 DIAGNOSIS — H524 Presbyopia: Secondary | ICD-10-CM | POA: Diagnosis not present

## 2021-10-25 DIAGNOSIS — N1831 Chronic kidney disease, stage 3a: Secondary | ICD-10-CM | POA: Diagnosis not present

## 2021-10-25 DIAGNOSIS — I779 Disorder of arteries and arterioles, unspecified: Secondary | ICD-10-CM | POA: Diagnosis not present

## 2021-10-25 DIAGNOSIS — E1169 Type 2 diabetes mellitus with other specified complication: Secondary | ICD-10-CM | POA: Diagnosis not present

## 2021-10-25 DIAGNOSIS — E7800 Pure hypercholesterolemia, unspecified: Secondary | ICD-10-CM | POA: Diagnosis not present

## 2021-10-25 DIAGNOSIS — I10 Essential (primary) hypertension: Secondary | ICD-10-CM | POA: Diagnosis not present

## 2021-10-25 DIAGNOSIS — M109 Gout, unspecified: Secondary | ICD-10-CM | POA: Diagnosis not present

## 2021-10-25 DIAGNOSIS — N400 Enlarged prostate without lower urinary tract symptoms: Secondary | ICD-10-CM | POA: Diagnosis not present

## 2021-10-25 DIAGNOSIS — Z0000 Encounter for general adult medical examination without abnormal findings: Secondary | ICD-10-CM | POA: Diagnosis not present

## 2021-11-15 ENCOUNTER — Other Ambulatory Visit (HOSPITAL_COMMUNITY): Payer: Self-pay | Admitting: Interventional Radiology

## 2021-11-15 DIAGNOSIS — I6522 Occlusion and stenosis of left carotid artery: Secondary | ICD-10-CM

## 2021-11-25 ENCOUNTER — Ambulatory Visit (HOSPITAL_COMMUNITY)
Admission: RE | Admit: 2021-11-25 | Discharge: 2021-11-25 | Disposition: A | Discharge status: Home / Self Care | Payer: BC Managed Care – PPO | Source: Ambulatory Visit | Attending: Interventional Radiology | Admitting: Interventional Radiology

## 2021-11-25 ENCOUNTER — Other Ambulatory Visit: Payer: Self-pay

## 2021-11-25 DIAGNOSIS — I6522 Occlusion and stenosis of left carotid artery: Secondary | ICD-10-CM | POA: Insufficient documentation

## 2021-12-01 ENCOUNTER — Telehealth (HOSPITAL_COMMUNITY): Payer: Self-pay

## 2021-12-01 NOTE — Telephone Encounter (Signed)
Called regarding recent imaging, no answer, left vm. AW  

## 2021-12-02 ENCOUNTER — Telehealth (HOSPITAL_COMMUNITY): Payer: Self-pay

## 2021-12-02 NOTE — Telephone Encounter (Signed)
Pt agreed to f/u in 6 months with a us carotid. AW 

## 2022-05-05 DIAGNOSIS — I10 Essential (primary) hypertension: Secondary | ICD-10-CM | POA: Diagnosis not present

## 2022-05-05 DIAGNOSIS — E1169 Type 2 diabetes mellitus with other specified complication: Secondary | ICD-10-CM | POA: Diagnosis not present

## 2022-05-05 DIAGNOSIS — N1831 Chronic kidney disease, stage 3a: Secondary | ICD-10-CM | POA: Diagnosis not present

## 2022-06-22 ENCOUNTER — Telehealth (HOSPITAL_COMMUNITY): Payer: Self-pay

## 2022-06-22 DIAGNOSIS — R5383 Other fatigue: Secondary | ICD-10-CM | POA: Diagnosis not present

## 2022-06-22 DIAGNOSIS — U071 COVID-19: Secondary | ICD-10-CM | POA: Diagnosis not present

## 2022-06-22 DIAGNOSIS — J029 Acute pharyngitis, unspecified: Secondary | ICD-10-CM | POA: Diagnosis not present

## 2022-06-22 DIAGNOSIS — R509 Fever, unspecified: Secondary | ICD-10-CM | POA: Diagnosis not present

## 2022-06-22 NOTE — Telephone Encounter (Signed)
Called to schedule us carotid, no answer, left vm. AW  

## 2022-07-11 ENCOUNTER — Other Ambulatory Visit (HOSPITAL_COMMUNITY): Payer: Self-pay | Admitting: Interventional Radiology

## 2022-07-11 DIAGNOSIS — I6522 Occlusion and stenosis of left carotid artery: Secondary | ICD-10-CM

## 2022-07-17 ENCOUNTER — Ambulatory Visit (HOSPITAL_COMMUNITY)
Admission: RE | Admit: 2022-07-17 | Discharge: 2022-07-17 | Disposition: A | Discharge status: Home / Self Care | Payer: BC Managed Care – PPO | Source: Ambulatory Visit | Attending: Interventional Radiology | Admitting: Interventional Radiology

## 2022-07-17 DIAGNOSIS — I6522 Occlusion and stenosis of left carotid artery: Secondary | ICD-10-CM | POA: Diagnosis not present

## 2022-07-17 NOTE — Progress Notes (Signed)
VASCULAR LAB    Carotid duplex has been performed.  See CV proc for preliminary results.   Cami Delawder, RVT 07/17/2022, 2:33 PM

## 2022-07-19 ENCOUNTER — Telehealth (HOSPITAL_COMMUNITY): Payer: Self-pay

## 2022-07-19 NOTE — Telephone Encounter (Signed)
Pt agreed to f/u in 6 months with a us carotid. AW 

## 2022-10-05 DIAGNOSIS — H02832 Dermatochalasis of right lower eyelid: Secondary | ICD-10-CM | POA: Diagnosis not present

## 2022-10-05 DIAGNOSIS — E119 Type 2 diabetes mellitus without complications: Secondary | ICD-10-CM | POA: Diagnosis not present

## 2022-10-05 DIAGNOSIS — H52203 Unspecified astigmatism, bilateral: Secondary | ICD-10-CM | POA: Diagnosis not present

## 2022-10-05 DIAGNOSIS — H02834 Dermatochalasis of left upper eyelid: Secondary | ICD-10-CM | POA: Diagnosis not present

## 2022-10-05 DIAGNOSIS — H02831 Dermatochalasis of right upper eyelid: Secondary | ICD-10-CM | POA: Diagnosis not present

## 2022-11-03 DIAGNOSIS — E7800 Pure hypercholesterolemia, unspecified: Secondary | ICD-10-CM | POA: Diagnosis not present

## 2022-11-03 DIAGNOSIS — Z0000 Encounter for general adult medical examination without abnormal findings: Secondary | ICD-10-CM | POA: Diagnosis not present

## 2022-11-03 DIAGNOSIS — E1169 Type 2 diabetes mellitus with other specified complication: Secondary | ICD-10-CM | POA: Diagnosis not present

## 2022-11-03 DIAGNOSIS — N1831 Chronic kidney disease, stage 3a: Secondary | ICD-10-CM | POA: Diagnosis not present

## 2022-11-03 DIAGNOSIS — E039 Hypothyroidism, unspecified: Secondary | ICD-10-CM | POA: Diagnosis not present

## 2022-11-03 DIAGNOSIS — I10 Essential (primary) hypertension: Secondary | ICD-10-CM | POA: Diagnosis not present

## 2022-11-03 DIAGNOSIS — Z125 Encounter for screening for malignant neoplasm of prostate: Secondary | ICD-10-CM | POA: Diagnosis not present

## 2022-11-03 DIAGNOSIS — M109 Gout, unspecified: Secondary | ICD-10-CM | POA: Diagnosis not present

## 2023-02-06 ENCOUNTER — Other Ambulatory Visit (HOSPITAL_COMMUNITY): Payer: Self-pay | Admitting: Interventional Radiology

## 2023-02-06 DIAGNOSIS — I6522 Occlusion and stenosis of left carotid artery: Secondary | ICD-10-CM

## 2023-02-20 ENCOUNTER — Ambulatory Visit (HOSPITAL_COMMUNITY)
Admission: RE | Admit: 2023-02-20 | Discharge: 2023-02-20 | Disposition: A | Discharge status: Home / Self Care | Payer: BC Managed Care – PPO | Source: Ambulatory Visit | Attending: Interventional Radiology | Admitting: Interventional Radiology

## 2023-02-20 DIAGNOSIS — I6522 Occlusion and stenosis of left carotid artery: Secondary | ICD-10-CM | POA: Insufficient documentation

## 2023-02-26 ENCOUNTER — Telehealth (HOSPITAL_COMMUNITY): Payer: Self-pay

## 2023-02-26 NOTE — Telephone Encounter (Signed)
Called pt regarding recent imaging, no answer, left vm. AB  

## 2023-02-28 ENCOUNTER — Telehealth (HOSPITAL_COMMUNITY): Payer: Self-pay

## 2023-02-28 NOTE — Telephone Encounter (Signed)
Pt agreed to f/u in 6 months with a us carotid. AB  

## 2023-08-13 ENCOUNTER — Telehealth (HOSPITAL_COMMUNITY): Payer: Self-pay

## 2023-08-13 ENCOUNTER — Other Ambulatory Visit (HOSPITAL_COMMUNITY): Payer: Self-pay | Admitting: Interventional Radiology

## 2023-08-13 DIAGNOSIS — I6522 Occlusion and stenosis of left carotid artery: Secondary | ICD-10-CM

## 2023-08-13 NOTE — Telephone Encounter (Signed)
Pt will call back to schedule US carotid when he gets his new work schedule. AB

## 2023-08-28 ENCOUNTER — Ambulatory Visit (HOSPITAL_COMMUNITY)
Admission: RE | Admit: 2023-08-28 | Discharge: 2023-08-28 | Disposition: A | Discharge status: Home / Self Care | Payer: Medicare Other | Source: Ambulatory Visit | Attending: Interventional Radiology | Admitting: Interventional Radiology

## 2023-08-28 DIAGNOSIS — I6522 Occlusion and stenosis of left carotid artery: Secondary | ICD-10-CM | POA: Diagnosis present

## 2023-08-29 ENCOUNTER — Telehealth (HOSPITAL_COMMUNITY): Payer: Self-pay

## 2023-08-29 NOTE — Telephone Encounter (Signed)
Pt agreed to f/u in 1 year with a US carotid. AB

## 2023-09-07 ENCOUNTER — Encounter (HOSPITAL_COMMUNITY): Payer: Self-pay

## 2024-04-19 ENCOUNTER — Encounter (HOSPITAL_COMMUNITY): Payer: Self-pay | Admitting: Interventional Radiology

## 2024-11-20 ENCOUNTER — Institutional Professional Consult (permissible substitution) (INDEPENDENT_AMBULATORY_CARE_PROVIDER_SITE_OTHER)

## 2024-11-21 ENCOUNTER — Encounter (INDEPENDENT_AMBULATORY_CARE_PROVIDER_SITE_OTHER): Payer: Self-pay

## 2024-11-21 ENCOUNTER — Ambulatory Visit (INDEPENDENT_AMBULATORY_CARE_PROVIDER_SITE_OTHER)

## 2024-11-21 ENCOUNTER — Other Ambulatory Visit (HOSPITAL_COMMUNITY)
Admission: RE | Admit: 2024-11-21 | Discharge: 2024-11-21 | Disposition: A | Discharge status: Home / Self Care | Source: Ambulatory Visit

## 2024-11-21 VITALS — BP 189/80 | HR 78 | Wt 275.0 lb

## 2024-11-21 DIAGNOSIS — R499 Unspecified voice and resonance disorder: Secondary | ICD-10-CM

## 2024-11-21 DIAGNOSIS — R490 Dysphonia: Secondary | ICD-10-CM | POA: Diagnosis not present

## 2024-11-21 DIAGNOSIS — Z8589 Personal history of malignant neoplasm of other organs and systems: Secondary | ICD-10-CM

## 2024-11-21 DIAGNOSIS — R1310 Dysphagia, unspecified: Secondary | ICD-10-CM | POA: Diagnosis not present

## 2024-11-21 DIAGNOSIS — R0981 Nasal congestion: Secondary | ICD-10-CM | POA: Diagnosis not present

## 2024-11-21 DIAGNOSIS — J342 Deviated nasal septum: Secondary | ICD-10-CM | POA: Diagnosis not present

## 2024-11-21 DIAGNOSIS — K140 Glossitis: Secondary | ICD-10-CM

## 2024-11-21 DIAGNOSIS — J343 Hypertrophy of nasal turbinates: Secondary | ICD-10-CM | POA: Diagnosis not present

## 2024-11-21 MED ORDER — CHLORHEXIDINE GLUCONATE 0.12 % MT SOLN: 15.0000 mL | Freq: Two times a day (BID) | OROMUCOSAL | 0 refills | Status: AC

## 2024-11-21 NOTE — Patient Instructions (Signed)
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 We performed a biopsy of the tongue lesion and addressed your chronic oropharyngeal changes, nasal congestion, and hoarseness.  YOUR PLAN: -SUSPICIOUS LESION OF THE TONGUE: The persistent lesion on the left side of your tongue is concerning due to your history of tongue cancer. We performed a punch biopsy to determine if the lesion is malignant. The specimen has been sent to pathology for evaluation, and we will follow up with you via phone once the results are available.  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 We recommend increasing your water intake to help with dry mouth and thick mucus. Additionally, using saline nasal spray can help with crusting and dryness.  -NASAL CONGESTION AND DEVIATED NASAL SEPTUM: Your mild nasal congestion is likely due to dry weather and vehicle heating. We recommend using saline nasal spray to alleviate the congestion.  -ODYNOPHAGIA AND HOARSENESS: Your painful swallowing and mild hoarseness are likely due to dry mouth and thick mucus from prior radiation treatment. Increasing your water intake can help alleviate these symptoms.  INSTRUCTIONS: We will contact you via phone once the biopsy results are available. In the meantime, please follow the recommendations provided for managing your symptoms.    Contains text generated by [employer].   "

## 2024-11-21 NOTE — Progress Notes (Signed)
 sDear Dr. Marvene, Here is my assessment for our mutual patient, Henry Bridges. Thank you for allowing me the opportunity to care for your patient. Please do not hesitate to contact me should you have any Henry questions. Sincerely, Dr. Penne Croak  Otolaryngology Clinic Note Referring provider: Dr. Marvene HPI:  Discussed the use of AI scribe software for clinical note transcription with the patient, who gave verbal consent to proceed.  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  Persistent Tongue Lesion: - Persistent lesion on the left side of the tongue, first identified by dentist and photographed - Lesion present for at least several weeks; exact duration uncertain - Lesion appeared smaller at a follow-up dental visit but has not resolved - Area is hard and painful with swallowing; no pain at rest - Uncertain if pain is due to tongue or throat involvement - Unaware of lesion's appearance until shown during this visit - No significant weight loss, only a five-pound decrease considered not unexpected  History of Tongue Cancer and Treatment: - Diagnosed with tongue cancer in 1998, initially presenting as a lesion under the right side of the tongue - Underwent biopsy confirming malignancy, resection of one third of the tongue, neck lymph node dissection, and extensive radiation therapy - No history of tobacco, alcohol, or marijuana use - Long-term surveillance with periodic endoscopic examinations  Radiation-Induced Oral and Oropharyngeal Changes: - Chronic oral dryness and thick mucus - Dental complications including jaw deterioration and recent tooth loss - Dental bridge placed in December - Stent in neck placed during hospitalization at [facility] during the COVID  pandemic, related to vascular issues and prior radiation  Nasal Congestion: - Mild nasal congestion, more pronounced with dry weather and use of vehicle heating - No significant nasal drainage or frequent nose blowing  Change in voice and odynophagia - Rough voice and hoarseness for 2 months - reports 5 lbs of weight loss.   Medications: - Takes 325 mg coated aspirin  daily - Previously on Henry anticoagulants due to neck stent, now transitioned to aspirin  alone    Independent Review of Additional Tests or Records:  Reviewed external note from referring PCP, Brake,describing relevant history incorporated into todays evaluation.   PMH/Meds/All/SocHx/FamHx/ROS:   Past Medical History:  Diagnosis Date   Arthritis    Diabetes mellitus without complication (HCC)    GERD (gastroesophageal reflux disease)    Gout    Hypertension    Hypothyroidism    PONV (postoperative nausea and vomiting)    Stroke (HCC)    TIA   Tongue cancer (HCC)      Past Surgical History:  Procedure Laterality Date   bone spur     CYST EXCISION     on tailbone   FOOT SURGERY     lump of veins that was removed   IR ANGIO INTRA EXTRACRAN SEL COM CAROTID INNOMINATE BILAT MOD SED  05/12/2019   IR ANGIO VERTEBRAL SEL VERTEBRAL BILAT MOD SED  05/12/2019   IR ANGIOGRAM EXTREMITY LEFT  05/12/2019   IR INTRAVSC STENT CERV CAROTID W/EMB-PROT MOD SED INCL ANGIO  05/22/2019   IR US  GUIDE VASC ACCESS RIGHT  05/12/2019   RADIOLOGY WITH ANESTHESIA N/A 05/22/2019   Procedure: RADIOLOGY WITH ANESTHESIA;  Surgeon: Dolphus Carrion, MD;  Location: [facility] OR;  Service: Radiology;  Laterality: N/A;   SHOULDER ARTHROSCOPY Bilateral    to clean out bone spurs   TONGUE SURGERY      Family History  Problem Relation Age of Onset   Parkinson's disease Mother    Heart disease Father      Social Connections: Not on file     Current Medications[1]   Physical Exam:   BP (!) 189/80 (BP Location: Right Arm, Patient Position:  Sitting)   Pulse 78   Wt 275 lb (124.7 kg)   SpO2 93%   BMI 38.90 kg/m   The patient was awake, alert, and appropriate. The external ears were inspected, and otoscopy was performed to evaluate the external auditory canals and tympanic membranes. The nasal cavity and septum were examined for mucosal changes, obstruction, or discharge. The oral cavity and oropharynx were inspected for mucosal lesions, infection, or tonsillar hypertrophy. The neck was palpated for lymphadenopathy, thyroid abnormalities, or Henry masses. Cranial nerve function was grossly intact.  Pertinent Findings: General: Well developed, well nourished. No acute distress. Voice with mild hoarseness Head/Face: Normocephalic. No sinus tenderness. Facial nerve intact and equal bilaterally. No facial lacerations. Eyes: PERRL, no scleral icterus or conjunctival hemorrhage. EOMI. Ears: No gross deformity. Normal external canal. Tympanic membrane with normal landmarks bilaterally Hearing: Normal speech reception.  Nose: No gross deformity or lesions. No purulent discharge. turbinate hypertrophy.  Mouth/Oropharynx: Lips without any lesions. Dentition fair, bridge anterior lower midline mandible.  Larynx: See TFL if applicable Nasopharynx: See TFL if applicable Neck: Trachea midline. No masses. No thyromegaly or nodules palpated. No crepitus. Lymphatic: No lymphadenopathy in the neck. Respiratory: No stridor or distress. Room air. Cardiovascular: Regular rate and rhythm. Extremities: No edema or cyanosis. Warm and well-perfused. Skin: No scars or lesions on face or neck. Neurologic: CN II-XII grossly intact. Moving all extremities without gross abnormality. Henry:  Physical Exam HEENT: Atraumatic, normocephalic. Left dorsal oral tongue with 5 mm ulceration and surrounding induration. Nose with midline septum mostly midline, small septal spur to the left posteriolry and mild nasal congestion. Throat with crust and radiation changes  in the epiglottis.  Seprately Identifiable Procedures:  I personally ordered, reviewed and interpreted the following with the patient today  Given the patient's symptoms and incomplete visualization of critical sinonasal areas with anterior rhinoscopy, a separately performed diagnostic nasal endoscopy procedure is indicated for a complete rhinologic evaluation per American Rhinologic Society recommendations (https://[email or web address]-rhinologic.org/position-statements)  I personally performed, reviewed and interpreted the following with the patient today  Procedure Note Diagnostic Nasal Endoscopy CPT CODE -- 68768 - Mod 25  Prior to initiating any procedures, risks/benefits/alternatives were explained to the patient and verbal consent obtained.  Pre-procedure diagnosis: Concern for obstruction vs infection, history of malignancy Post-procedure diagnosis: same Indication: See pre-procedure diagnosis and physical exam above Complications: None apparent EBL: 0 mL Anesthesia: Lidocaine  4% and topical decongestant was topically sprayed in each nasal cavity  Description of Procedure:  Patient was identified. A flexible fiberoptic endoscope was utilized to evaluate the sinonasal cavities, mucosa, sinus ostia and turbinates and septum.  Overall, signs of mucosal inflammation are not noted.  Also noted are crusting and dry mucosa.  No mucopurulence, polyps, or masses noted.   Right Middle meatus: clear Right SE Recess: clear Left MM: clear Left SE Recess: clear Photodocumentation was obtained.  Procedure Note Pre-procedure diagnosis:  Dysphonia, history of head and neck malignancy, odynophagia Post-procedure diagnosis: Same Procedure: Transnasal Fiberoptic Laryngoscopy, CPT 31575 - Mod 25 Indication: Dysphonia, history of head  and neck malignancy, history of radiation Complications: None apparent EBL: 0 mL  The procedure was undertaken to further evaluate the patient's complaint of dysphonia  and odynophagia, with mirror exam inadequate for appropriate examination due to gag reflex and poor patient tolerance  Procedure:  Patient was identified as correct patient. Verbal consent was obtained. The nose was sprayed with oxymetazoline and 4% lidocaine . The The flexible laryngoscope was passed through the nose to view the nasal cavity, pharynx (oropharynx, hypopharynx) and larynx.  The larynx was examined at rest and during multiple phonatory tasks. Documentation was obtained and reviewed with patient. The scope was removed. The patient tolerated the procedure well.  Findings: The nasal cavity and nasopharynx did not reveal any masses or lesions, mucosa appeared to be without obvious lesions. The tongue base, pharyngeal walls, piriform sinuses, vallecula, epiglottis and postcricoid region are normal in appearance EXCEPT: post radiaiton changes to epiglotis and dry mucosa. The visualized portion of the subglottis and proximal trachea is widely patent. The vocal folds are mobile bilaterally. There are no lesions on the free edge of the vocal folds nor elsewhere in the larynx worrisome for malignancy.    Electronically signed by: Penne Croak, DO 11/21/2024 12:30 PM  Procedure Note - Punch Biopsy  Name: Henry Bridges MRN: [id number] Date: 11/21/24   Pre-procedure diagnosis: left tongue Lesion, concern for malignancy, history of malignancy Post-procedure diagnosis: Same Procedure: Biopsy of left tongue Lesion  Tongue: CPT 41100 Complications: None apparent EBL: 2cc mL Date: 11/21/24  Indication: left Lesion, concern for malignancy  Risks and benefits of biopsy were explained to the patient, written consent was obtained.   The patient was identified as the correct patient.  The area surrounding the lesion was injected 1% Lidocaine  with 1:100,000 epinephrine  and allowed to work.. A 3mm mm punch biopsy was used to make a circular incision in the periphery of the lesion. The tissue was  carefully picked up with a forcep and cut free with sharp scissors. It was placed in formalin and passed off the field. There was minimal bleeding that fully resolved prior to the end of the procedure with the following interventions: silver nitrate. The patient tolerated the procedure well.    Electronically signed by: Penne Croak, DO 11/21/2024 12:30 PM    Impression & Plans:  Henry Bridges is a 74 y.o. male  1. Nasal congestion   2. Tongue ulcer   3. History of malignant neoplasm of head and neck   4. DNS (deviated nasal septum)   5. Nasal turbinate hypertrophy   6. Odynophagia   7. Hoarseness or changing voice     - Findings and diagnoses discussed in detail with the patient. - Risks, benefits, and alternatives were reviewed. Through shared decision making, the patient elects to proceed with below.  Assessment and Plan Assessment & Plan Suspicious lesion of the tongue Persistent, indurated lesion on left lateral tongue with significant concern for malignancy due to oncologic history and atypical location. Biopsy necessary for definitive diagnosis despite increased bleeding risk from aspirin  use. - Performed punch biopsy of the tongue lesion. - Sent specimen to pathology for evaluation. - Discussed anticipated follow-up via phone call once results are available.  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 Jaw deterioration and dental issues noted. - Recommended increased water intake to address xerostomia and thick mucus. - Advised use of saline nasal spray for crusting and dryness.  Nasal congestion and deviated  nasal septum Mild nasal congestion with midline nasal septum. - Advised use of saline nasal spray for mild congestion.  Odynophagia and hoarseness Odynophagia and mild hoarseness likely due to radiation-induced xerostomia and thick mucus. -  Recommended increased water intake to alleviate xerostomia and hoarseness.  - Orders placed: No orders of the defined types were placed in this encounter.  - Medications prescribed/continued/adjusted:  Meds ordered this encounter  Medications   chlorhexidine  (PERIDEX ) 0.12 % solution    Sig: Use as directed 15 mLs in the mouth or throat 2 (two) times daily for 7 days.    Dispense:  210 mL    Refill:  0   - Education materials provided to the patient. - Follow up: 3 weeks. Patient instructed to return sooner or go to the ED if new/worsening symptoms develop.   Thank you for allowing me the opportunity to care for your patient. Please do not hesitate to contact me should you have any Henry questions.  Sincerely, Penne Croak, DO Otolaryngologist (ENT) [facility] Phone: [phone] Fax: [phone]  11/21/2024, 12:30 PM        [1]  Current Outpatient Medications:    amLODipine (NORVASC) 10 MG tablet, Take 10 mg by mouth daily., Disp: , Rfl:    aspirin  325 MG EC tablet, Take 325 mg by mouth daily., Disp: , Rfl:    atorvastatin  (LIPITOR) 40 MG tablet, Take 40 mg by mouth daily at 6 PM., Disp: , Rfl:    chlorhexidine  (PERIDEX ) 0.12 % solution, Use as directed 15 mLs in the mouth or throat 2 (two) times daily for 7 days., Disp: 210 mL, Rfl: 0   doxazosin (CARDURA) 2 MG tablet, Take 2 mg by mouth daily., Disp: , Rfl:    hydrochlorothiazide (HYDRODIURIL) 25 MG tablet, Take 25 mg by mouth daily., Disp: , Rfl:    lansoprazole (PREVACID) 30 MG capsule, Take 30 mg by mouth daily at 12 noon., Disp: , Rfl:    levothyroxine  (SYNTHROID ) 125 MCG tablet, Take 125 mcg by mouth daily before breakfast., Disp: , Rfl:    losartan (COZAAR) 100 MG tablet, Take 100 mg by mouth daily., Disp: , Rfl:    metFORMIN (GLUCOPHAGE) 1000 MG tablet, Take 1,000 mg by mouth 2 (two) times daily with a meal., Disp: , Rfl:    Omega-3 Fatty Acids (FISH OIL) 1000 MG CPDR, Take 1,000-2,000 mg by  mouth See admin instructions. Take 2 capsules in the morning, and 1 capsules in the evening, Disp: , Rfl:    glipiZIDE (GLUCOTROL XL) 10 MG 24 hr tablet, 1 tablet Orally Twice a day, Disp: , Rfl:    JANUVIA 50 MG tablet, 1 tablet Orally Once a day; Duration: 90 days, Disp: , Rfl:    pioglitazone (ACTOS) 45 MG tablet, 1 tablet Orally Once a day, Disp: , Rfl:    UNABLE TO FIND, 4 capsules daily. Med Name: [place] Life long vitality pack. Essential oil supplements. There are 3 different caps. Take four capsules of vEO Mega, Vegan Microplex VMZ, and Alpha CRS+ per day with food., Disp: , Rfl:

## 2024-11-25 ENCOUNTER — Ambulatory Visit (INDEPENDENT_AMBULATORY_CARE_PROVIDER_SITE_OTHER): Payer: Self-pay

## 2024-11-25 ENCOUNTER — Ambulatory Visit (INDEPENDENT_AMBULATORY_CARE_PROVIDER_SITE_OTHER)

## 2024-11-25 DIAGNOSIS — Z8589 Personal history of malignant neoplasm of other organs and systems: Secondary | ICD-10-CM

## 2024-11-25 DIAGNOSIS — C029 Malignant neoplasm of tongue, unspecified: Secondary | ICD-10-CM

## 2024-11-25 LAB — SURGICAL PATHOLOGY

## 2024-11-25 NOTE — Telephone Encounter (Signed)
 I called the pathology department and requested DOI in mm.

## 2024-11-26 NOTE — Progress Notes (Signed)
 Video Visit Note - Oral Tongue SCCa Results  Visit Type: Synchronous video visit (patient at home, physician in office) Consent: Patient consented to video visit; risks, benefits, and limitations discussed.  Chief Complaint  Discussion of biopsy results and next steps  HPI  Patient seen via video visit to review pathology results from recent left medial tongue biopsy. 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 The deep margin of biopsy was positive.  I reviewed the diagnosis in detail with the patient, including the significance of p16-negative disease, depth of invasion, and positive margin. We discussed the need for complete staging and definitive surgical management pending imaging results.  The patient reports mild tongue soreness at the biopsy site but no significant dysphagia, odynophagia, bleeding, or airway symptoms. No new neck masses reported.  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, p16 negative Positive deep margin on biopsy Need for complete staging evaluation  Plan Order CT neck with contrast for nodal evaluation Order CT chest with contrast for metastatic screening Order PET-CT for complete staging Referral to [facility] and Neck Oncology department placed Discussed likely need for surgical resection of the primary tumor with consideration of neck management depending on imaging results Patient educated on red-flag symptoms including worsening pain, dysphagia, bleeding, or airway changes  All questions answered; patient verbalized understanding Will follow up promptly once imaging is completed to finalize treatment plan  Physical Exam  Limited by video  visit. Patient alert, oriented, and in no acute distress Speech intelligible No obvious facial asymmetry Neck masses not appreciable via video  MDM High-complexity medical decision making due to:  Newly diagnosed malignancy Review of pathology Ordering of multiple staging imaging studies Discussion of oncologic treatment planning  Time Total physician time spent on date of service: 45 minutes, including chart review, video visit, counseling, documentation, and coordination of care.

## 2024-11-27 ENCOUNTER — Encounter (INDEPENDENT_AMBULATORY_CARE_PROVIDER_SITE_OTHER): Payer: Self-pay

## 2024-12-04 ENCOUNTER — Ambulatory Visit (HOSPITAL_COMMUNITY)

## 2024-12-16 ENCOUNTER — Ambulatory Visit (INDEPENDENT_AMBULATORY_CARE_PROVIDER_SITE_OTHER)
